# Patient Record
Sex: Female | Born: 1975
Health system: Southern US, Community
[De-identification: ages and names within clinical notes are randomized; demographics above are authoritative.]

## PROBLEM LIST (undated history)

## (undated) DIAGNOSIS — I10 Essential (primary) hypertension: Secondary | ICD-10-CM

## (undated) DIAGNOSIS — R011 Cardiac murmur, unspecified: Secondary | ICD-10-CM

## (undated) DIAGNOSIS — R896 Abnormal cytological findings in specimens from other organs, systems and tissues: Secondary | ICD-10-CM

## (undated) DIAGNOSIS — IMO0001 Reserved for inherently not codable concepts without codable children: Secondary | ICD-10-CM

## (undated) DIAGNOSIS — E119 Type 2 diabetes mellitus without complications: Secondary | ICD-10-CM

## (undated) DIAGNOSIS — T7840XA Allergy, unspecified, initial encounter: Secondary | ICD-10-CM

## (undated) DIAGNOSIS — D649 Anemia, unspecified: Secondary | ICD-10-CM

## (undated) HISTORY — DX: Allergy, unspecified, initial encounter: T78.40XA

## (undated) HISTORY — DX: Reserved for inherently not codable concepts without codable children: IMO0001

## (undated) HISTORY — DX: Essential (primary) hypertension: I10

## (undated) HISTORY — DX: Anemia, unspecified: D64.9

## (undated) HISTORY — PX: GALLBLADDER SURGERY: SHX652

## (undated) HISTORY — PX: CHOLECYSTECTOMY: SHX55

## (undated) HISTORY — DX: Cardiac murmur, unspecified: R01.1

## (undated) HISTORY — DX: Type 2 diabetes mellitus without complications: E11.9

## (undated) HISTORY — PX: WISDOM TOOTH EXTRACTION: SHX21

## (undated) HISTORY — DX: Abnormal cytological findings in specimens from other organs, systems and tissues: R89.6

---

## 2004-05-26 ENCOUNTER — Emergency Department (HOSPITAL_COMMUNITY): Admission: EM | Admit: 2004-05-26 | Discharge: 2004-05-26 | Payer: Self-pay | Admitting: Emergency Medicine

## 2004-11-27 HISTORY — PX: OTHER SURGICAL HISTORY: SHX169

## 2005-01-27 ENCOUNTER — Other Ambulatory Visit: Admission: RE | Admit: 2005-01-27 | Discharge: 2005-01-27 | Payer: Self-pay | Admitting: Family Medicine

## 2006-02-08 ENCOUNTER — Other Ambulatory Visit: Admission: RE | Admit: 2006-02-08 | Discharge: 2006-02-08 | Payer: Self-pay | Admitting: Family Medicine

## 2006-08-21 ENCOUNTER — Emergency Department (HOSPITAL_COMMUNITY): Admission: EM | Admit: 2006-08-21 | Discharge: 2006-08-21 | Payer: Self-pay | Admitting: Emergency Medicine

## 2006-11-05 ENCOUNTER — Other Ambulatory Visit: Admission: RE | Admit: 2006-11-05 | Discharge: 2006-11-05 | Payer: Self-pay | Admitting: Obstetrics and Gynecology

## 2007-03-09 ENCOUNTER — Emergency Department (HOSPITAL_COMMUNITY): Admission: EM | Admit: 2007-03-09 | Discharge: 2007-03-09 | Payer: Self-pay | Admitting: Emergency Medicine

## 2007-05-10 ENCOUNTER — Emergency Department (HOSPITAL_COMMUNITY): Admission: EM | Admit: 2007-05-10 | Discharge: 2007-05-11 | Payer: Self-pay | Admitting: Emergency Medicine

## 2007-06-06 ENCOUNTER — Encounter (INDEPENDENT_AMBULATORY_CARE_PROVIDER_SITE_OTHER): Payer: Self-pay | Admitting: General Surgery

## 2007-06-07 ENCOUNTER — Inpatient Hospital Stay (HOSPITAL_COMMUNITY): Admission: RE | Admit: 2007-06-07 | Discharge: 2007-06-08 | Payer: Self-pay | Admitting: General Surgery

## 2007-06-14 ENCOUNTER — Ambulatory Visit: Payer: Self-pay | Admitting: Internal Medicine

## 2008-07-08 IMAGING — RF DG CHOLANGIOGRAM OPERATIVE
1 series · 10 of 10 positions shown · non-contrast
Comparison: none

CLINICAL DATA: 30 year-old-female with cholecystitis.  Intraoperative cholangiogram.  Cannulation of the cystic duct.  The common bile duct is dilated.  There are common bile duct stones.  The visualized intrahepatic ducts appear slightly prominent also. There is contrast draining in the duodenum.

[Series 1: run · 4 acquisitions, 10 frames shown]
[im 1/4]
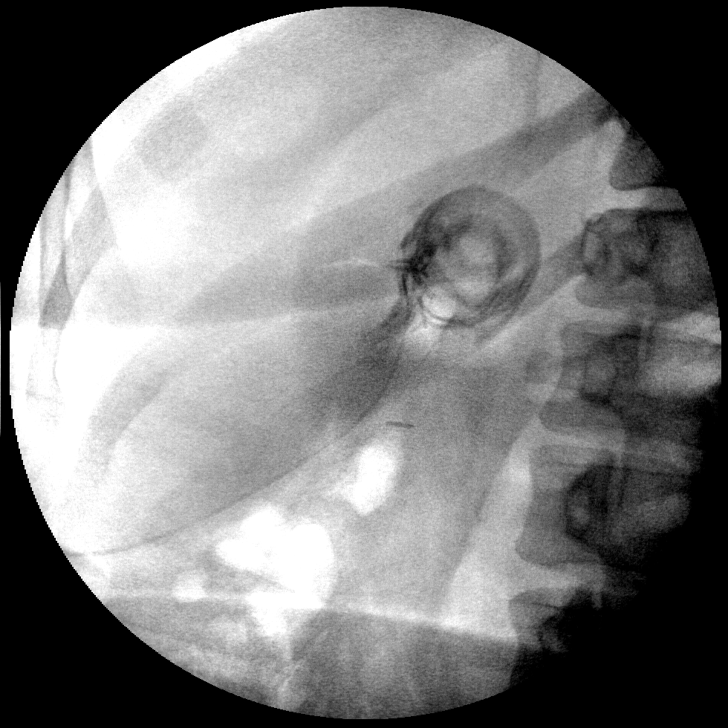
[im 1/4]
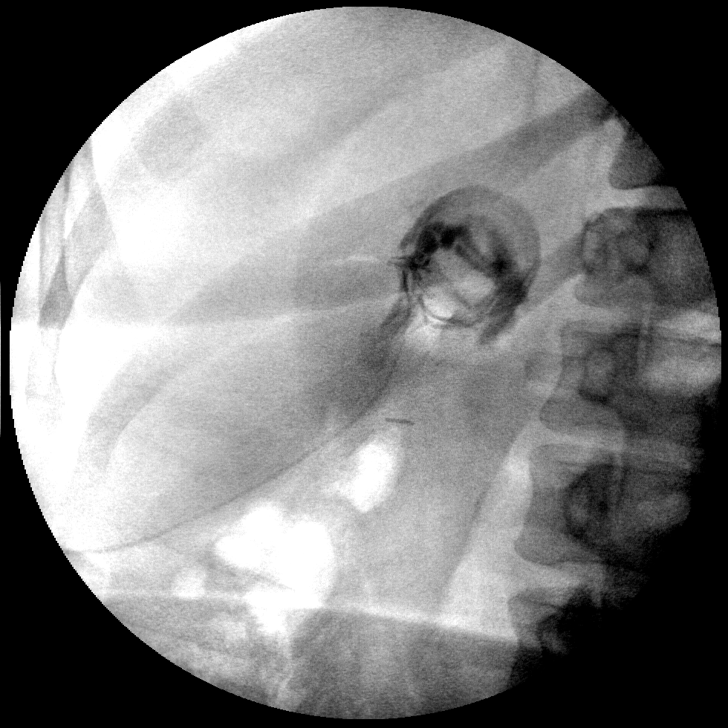
[im 1/4]
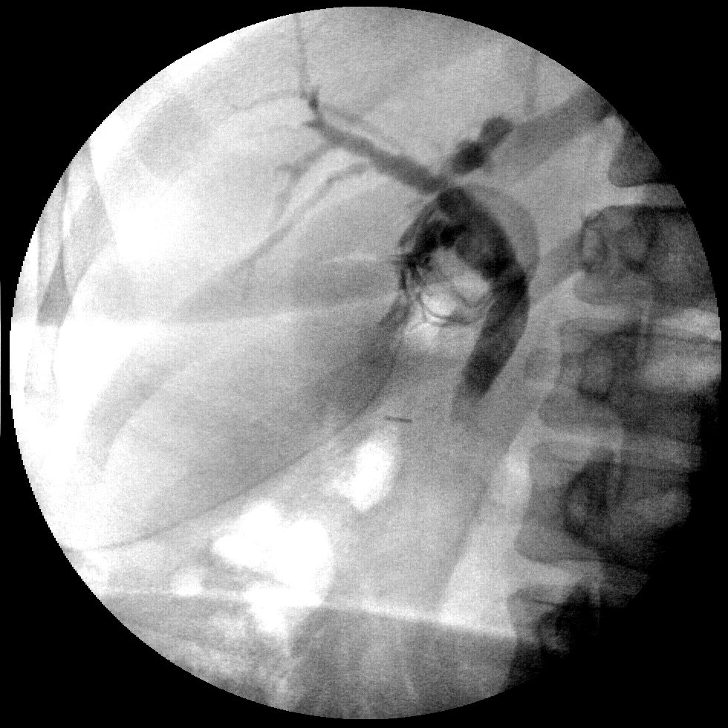
[im 1/4]
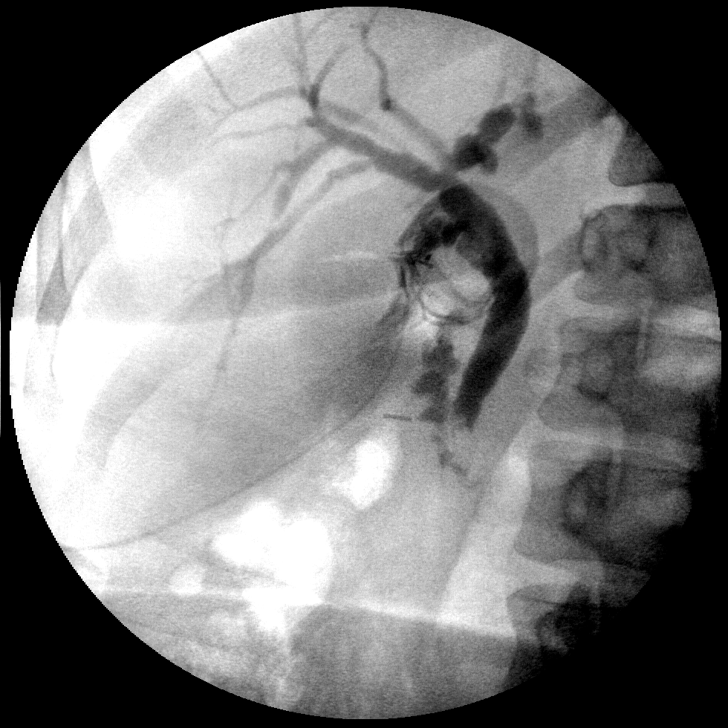
[im 2/4]
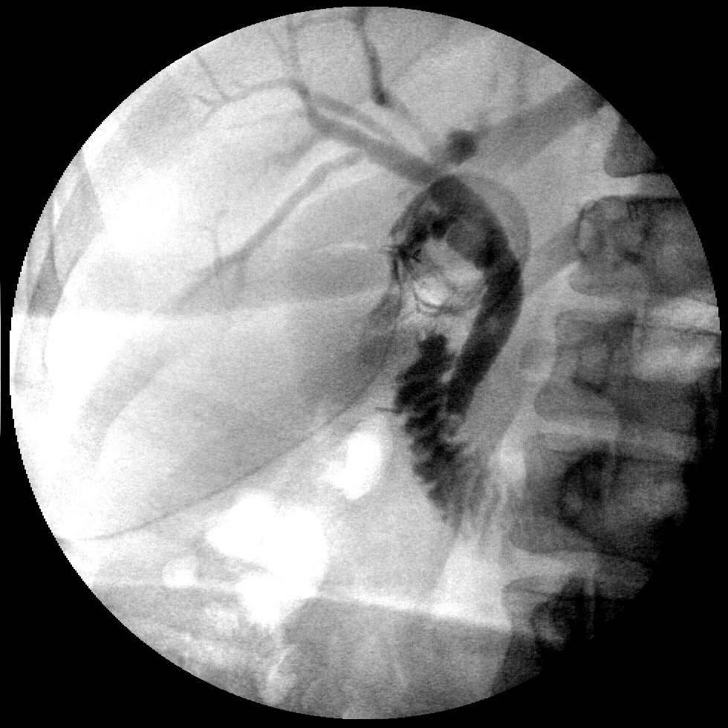
[im 2/4]
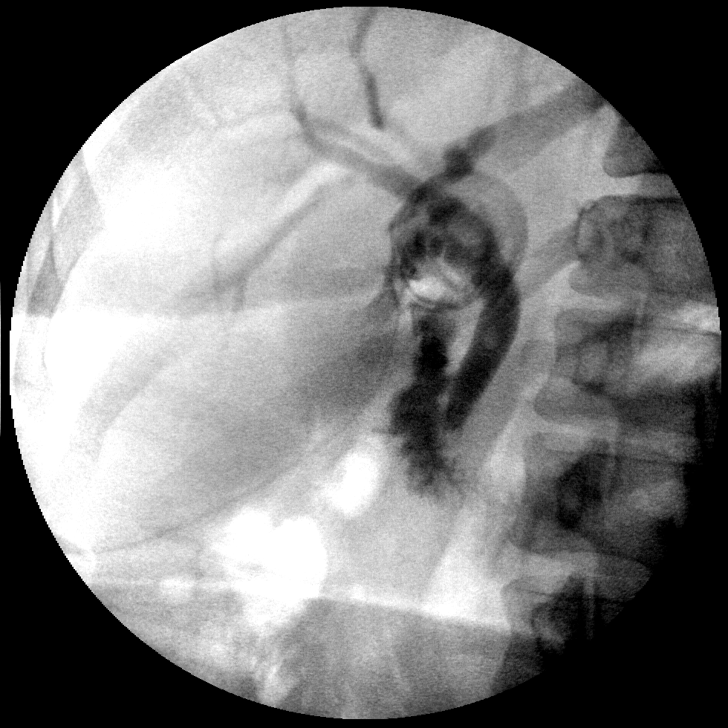
[im 2/4]
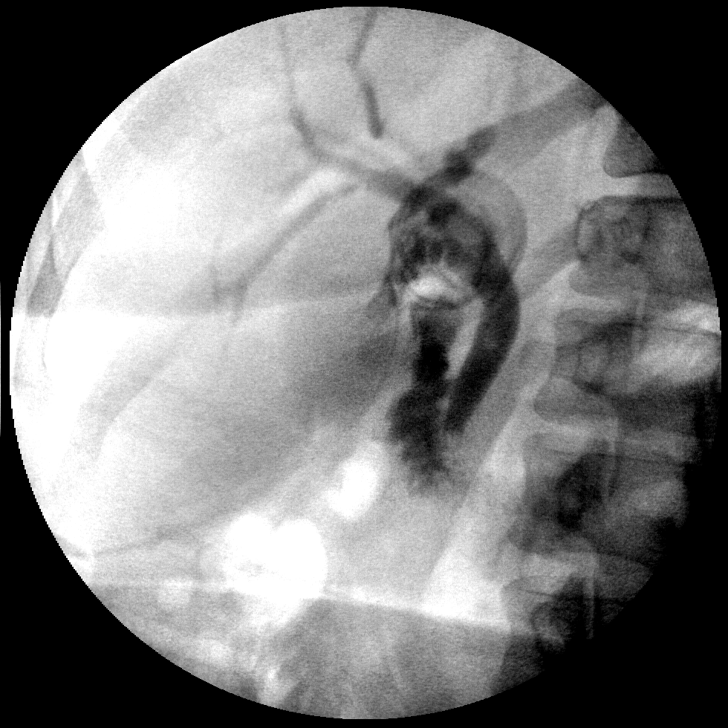
[im 2/4]
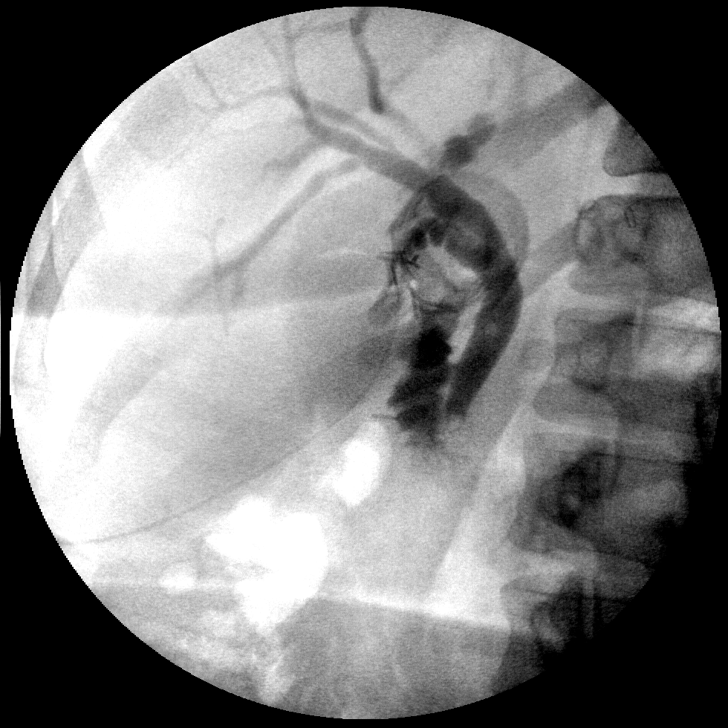
[im 3/4]
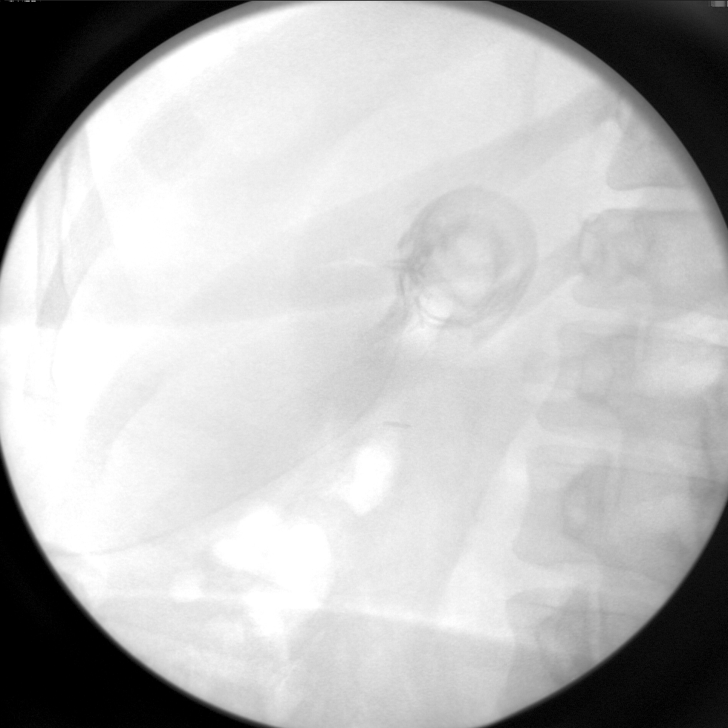
[im 4/4]
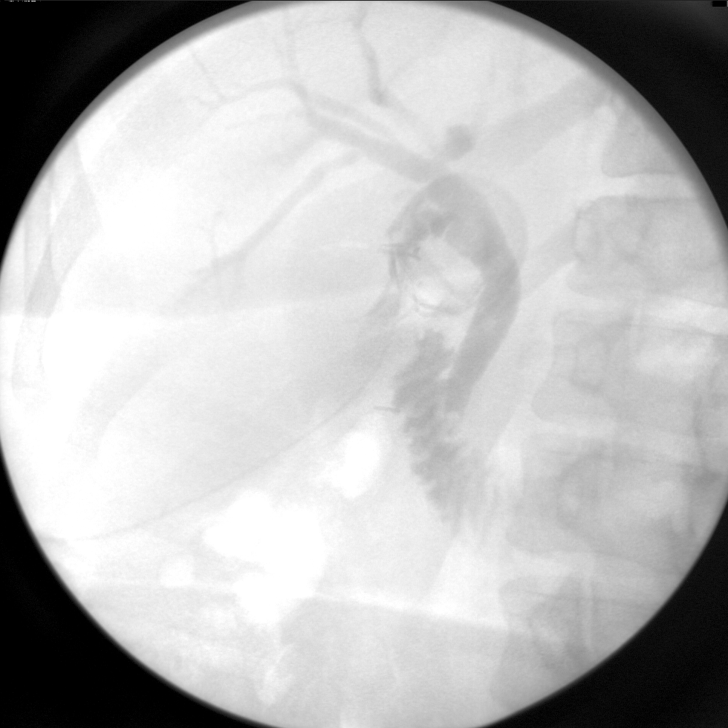

[10 of 10 positions shown; findings below may reference images not displayed]

IMPRESSION: Dilated common bile duct with common bile duct stones.

## 2008-07-09 IMAGING — XA DG ERCP WO/W SPHINCTEROTOMY
4 series · 4 of 4 positions shown · non-contrast
Comparison: none

CLINICAL DATA: Gallstones.  
 ERCP WITH SPHINCEROTOMY:

[Series 1: cont. · 1 of 1 slices shown (1 of 4)]
[im 1/1]
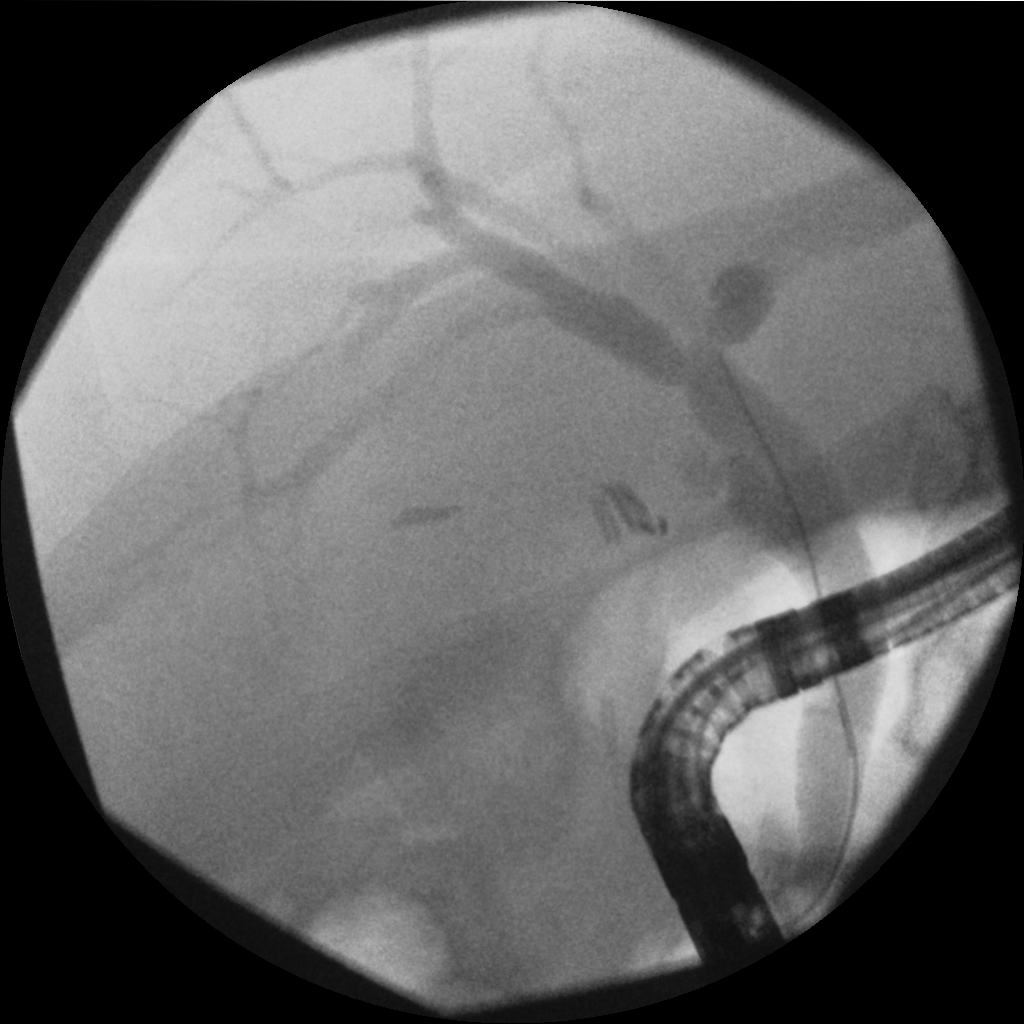

[Series 2: cont. · 1 of 1 slices shown (2 of 4)]
[im 1/1]
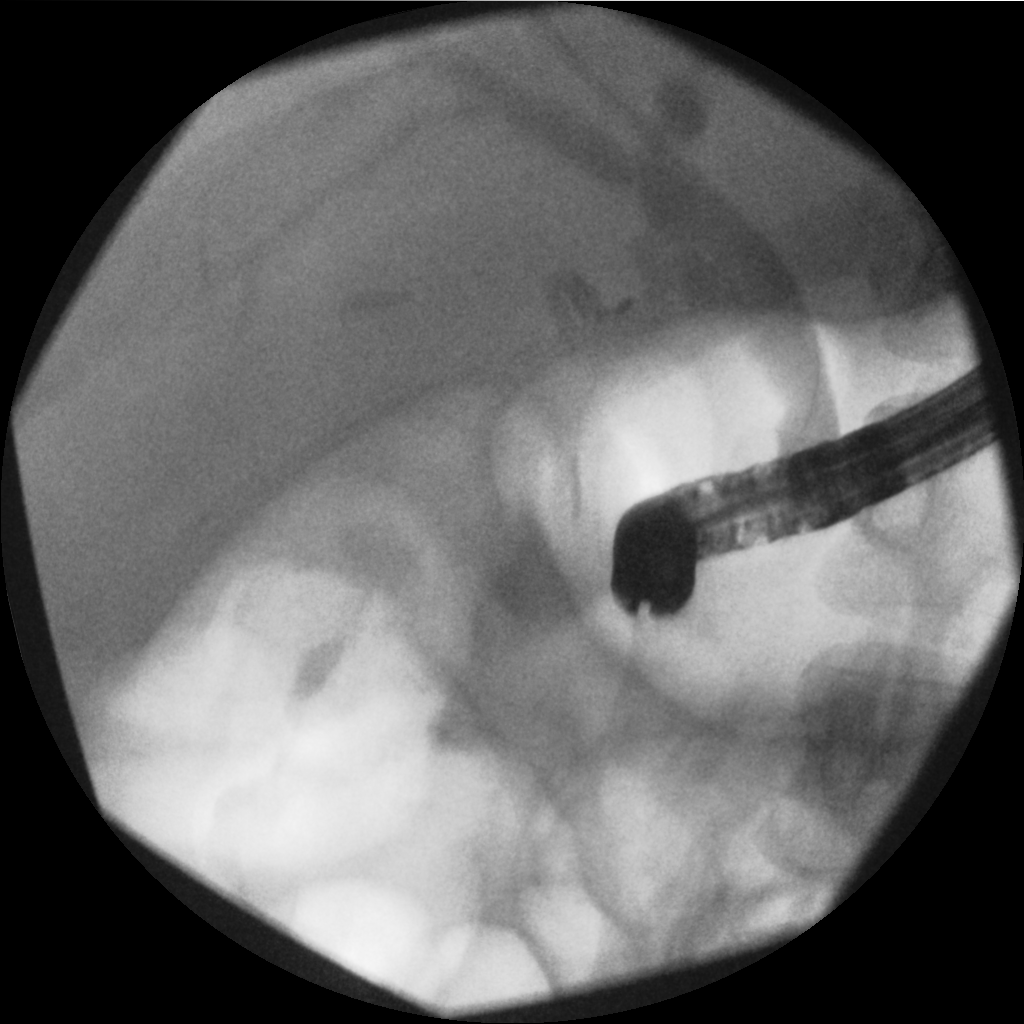

[Series 3: cont. · 1 of 1 slices shown (3 of 4)]
[im 1/1]
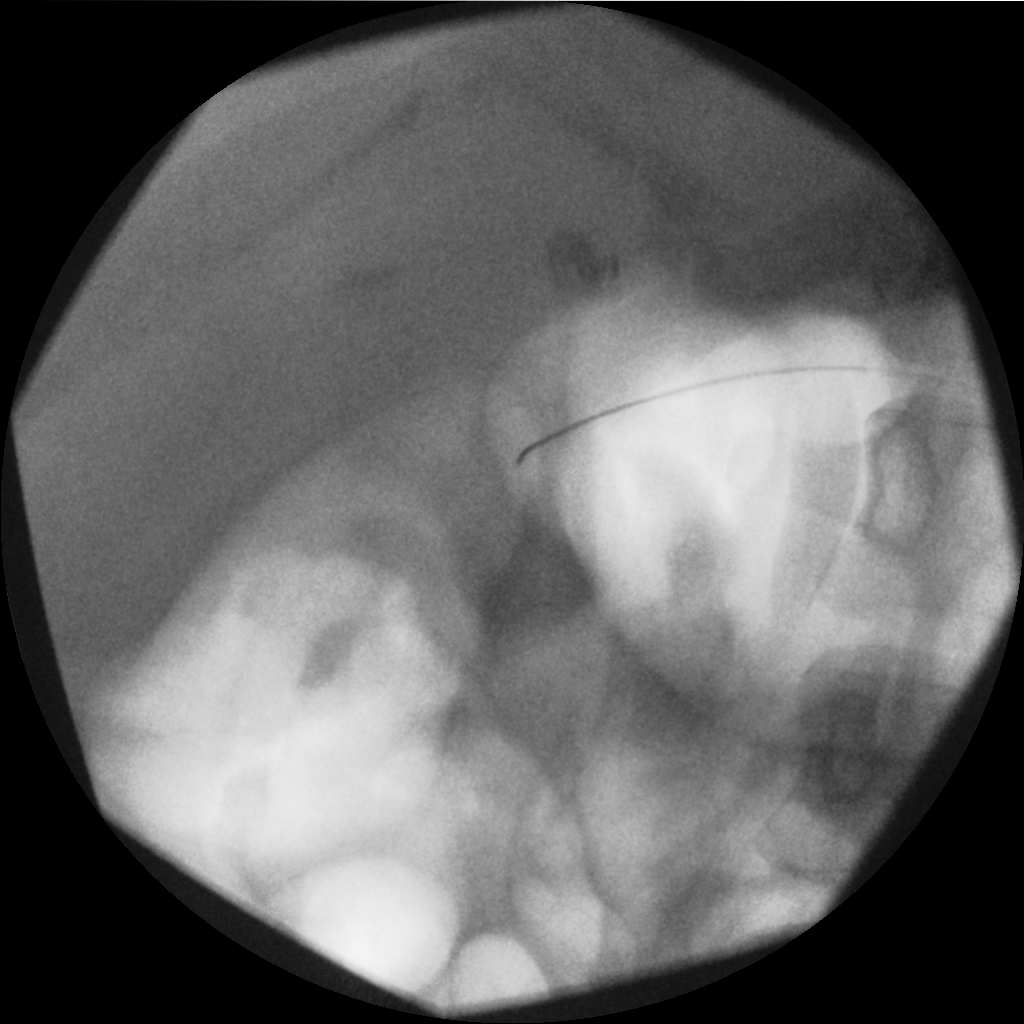

[Series 4: cont. · 1 of 1 slices shown (4 of 4)]
[im 1/1]
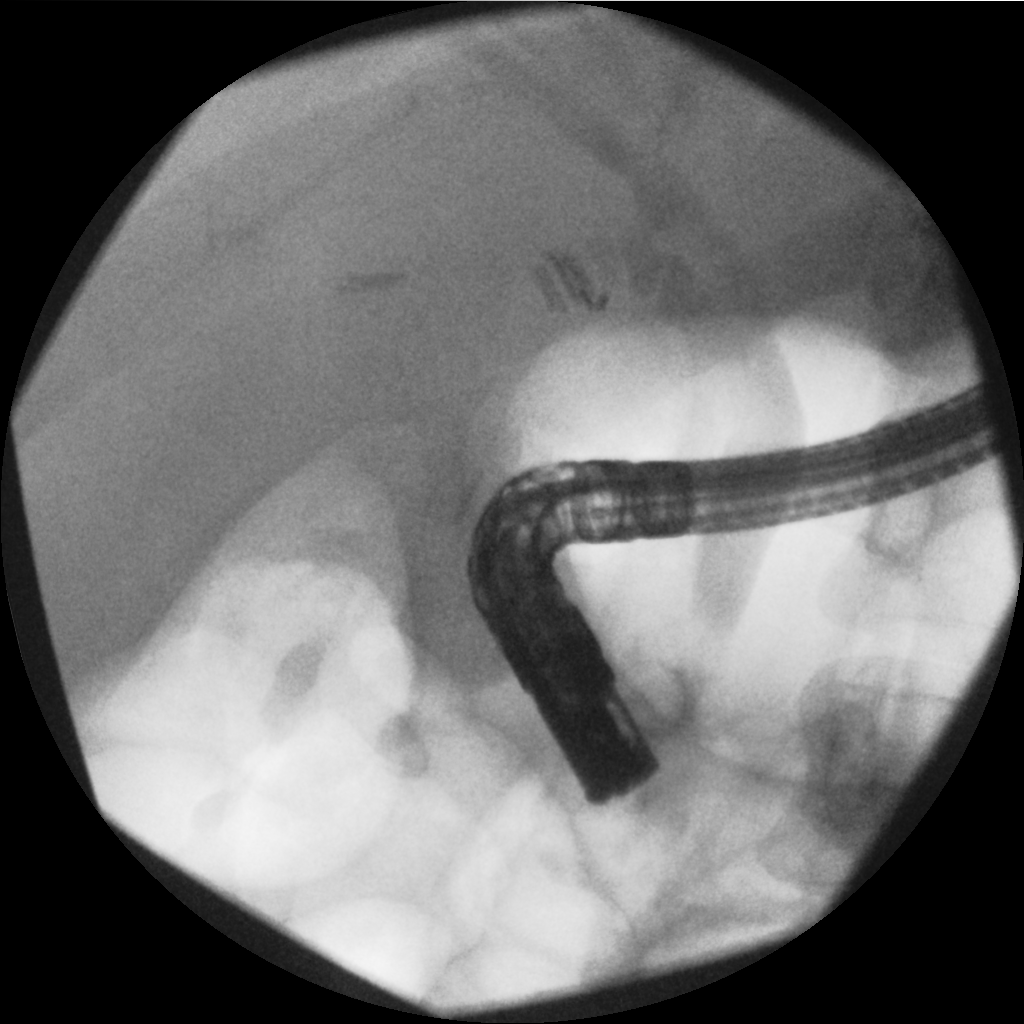

[4 of 4 positions shown; findings below may reference images not displayed]

FINDINGS: This study was done by Dr. Locklear.  Fluoroscopy was provided.  Radiologist was not involved during the exam. Apparently the patient underwent sphincterotomy with balloon sweeping.
IMPRESSION: Fluoroscopy provided for ERCP with sphincterotomy and stone retrieval.

## 2009-05-07 ENCOUNTER — Other Ambulatory Visit: Admission: RE | Admit: 2009-05-07 | Discharge: 2009-05-07 | Payer: Self-pay | Admitting: Obstetrics and Gynecology

## 2009-12-20 ENCOUNTER — Other Ambulatory Visit: Admission: RE | Admit: 2009-12-20 | Discharge: 2009-12-20 | Payer: Self-pay | Admitting: Obstetrics and Gynecology

## 2010-03-25 LAB — HEPATIC FUNCTION PANEL
AST: 20 U/L (ref 13–35)
Bilirubin, Total: 0.5 mg/dL

## 2010-03-25 LAB — BASIC METABOLIC PANEL
BUN: 9 mg/dL (ref 4–21)
Glucose: 91 mg/dL
Potassium: 4.2 mmol/L (ref 3.4–5.3)
Sodium: 139 mmol/L (ref 137–147)

## 2010-03-25 LAB — LIPID PANEL
HDL: 41 mg/dL (ref 35–70)
Triglycerides: 92 mg/dL (ref 40–160)

## 2010-07-11 ENCOUNTER — Other Ambulatory Visit: Admission: RE | Admit: 2010-07-11 | Discharge: 2010-07-11 | Payer: Self-pay | Admitting: Obstetrics and Gynecology

## 2010-11-16 ENCOUNTER — Encounter
Admission: RE | Admit: 2010-11-16 | Discharge: 2010-11-16 | Payer: Self-pay | Source: Home / Self Care | Attending: Family Medicine | Admitting: Family Medicine

## 2011-04-11 NOTE — Op Note (Signed)
Cynthia Lee, Cynthia Lee NO.:  [id number]   MEDICAL RECORD NO.:  [id number]          PATIENT TYPE:  OIB   LOCATION:  1533                         FACILITY:  [facility]   PHYSICIAN:  Anselm Pancoast. Weatherly, M.D.DATE OF BIRTH:  [birth year]   DATE OF PROCEDURE:  06/06/2007  DATE OF DISCHARGE:                               OPERATIVE REPORT   PREOPERATIVE DIAGNOSES:  Chronic cholecystitis with stones.   POSTOPERATIVE DIAGNOSES:  Chronic cholecystitis, many stones with  numerous stones and a common bile duct.   OPERATION:  Laparoscopic cholecystectomy with cholangiogram.   HISTORY:  Cynthia Lee is a 35 year old, overweight female, about 330  pounds, who has been to the emergency room and was released after having  episodes of epigastric pain.  It appears that she has had these pains  off and on and has had previous ultrasounds of the gallbladder that have  not noted stones.  This time, her ultrasound did show numerous stones.  It did not appear to be an acute cholecystitis and she was released.  They checked her liver function studies and they were definitely  abnormal.  Bilirubin was not elevated, but the SGOT and SGPT were  markedly elevated.  I saw her in the office on Monday and sent her for  repeat liver function studies and the repeat liver function studies were  improved with the SGOT and SGPT in about the 175 range and I elected to  proceed on with the cholangiogram.  They had noted, on the ultrasound,  that they thought that the bile duct was not dilated.  She is 330 pounds  and short, so I am sure it is difficult to see the structures extremely  well.  Preoperatively, she was given 400 mg of Cipro.  We have reviewed  the whole gallbladder booklet with her and she is aware that, if there  are stones found in the common bile duct, we will set up for an ERCP  tomorrow.   The patient was taken to the operating suite.  Dr. Shireen Quan was able to  successfully intubate  her.  They had a wedge up under her back and then  induction of general anesthesia.  I had the wedge removed, so we could  lay her flat, since she is so heavy, and it gives [place] better exposure to  the abdomen and she was prepped with Betadine surgical scrub and  solution and draped in a sterile manner.   I elected to make a little incision higher than the umbilicus, since she  is heavy enough and I think it would give [place] better visualization, and  then the upper 10 mm trocar was placed under direct vision, after we had  entered the peritoneal cavity and placed the Hasson cannula, after  traction sutures were placed.  The upper gallbladder area was very  dilated, but not acutely inflamed, and the two lateral 5 mm trocars were  placed.  We positioned her so that she had her head up, turned way to  the left, and then, to grasp the gallbladder, retract it upward,  outward, and  then the proximal portion of the gallbladder could be  finally visualized.  We then very carefully opened the peritoneum over  the distal cystic duct gallbladder area and I could identify the cystic  artery that was carefully separated from the cystic duct.  We put two  clips on it proximally, singly distally and divided and then very  carefully went around the cystic duct.  It looks like it is definitely  dilated.  I placed a clip flush with the cystic duct gallbladder  junction, made a little small opening, and then we could slip the hook  catheter into the proximal, held in place with a clip, and obtained an x-  ray.  The x-ray does show definitely numerous stones in the distal  common bile duct and we will need to get an ERCP tomorrow.  We then  cleaned off the cystic duct, so I could actually get four clips across  it.  I thought that we would possibly have to use a little Endoloop, but  since I got four good clips on it, I think that will be adequate.   With the cystic duct divided, then we could start very  carefully peeling  the distended gallbladder away from the gallbladder bed.  Good  hemostasis was obtained and then the gallbladder was placed into an  EndoCatch bag.  We then brought the bag up to the Hasson cannula,  withdrew both cameras in the upper 10 mm port, and then opened the bag  and probably removed 500 gallstones.  Most of them were about 3 x 4 mm  in size.  There were five or six stones in the gallbladder that were 1.5  cm in size.  The patient's fascia, after the bag containing the stones  and all were removed, was closed with figure-of-eight of 0 Vicryl in  addition to the two traction sutures that were also tied.  We  anesthetized the fascia.  There was no evidence of any bleeding or bile.  The 5 mm ports were withdrawn after a little irrigating fluid had been  aspirated, and then the upper 10 mm trocars were withdrawn under direct  vision after the CO2 was released.  The subcutaneous wounds were closed  with 4-0 Vicryl and benzoin and the patient sent to the recovery room in  a stable postoperative condition.   We will arrange for Dr. Leone Payor to see the patient for the ERCP,  hopefully tomorrow.           ______________________________  Anselm Pancoast. Zachery Dakins, M.D.     WJW/MEDQ  D:  06/06/2007  T:  06/07/2007  Job:  [id number]

## 2011-04-14 NOTE — Discharge Summary (Signed)
[name], [name] NO.:  [id number]   MEDICAL RECORD NO.:  [id number]          PATIENT TYPE:  INP   LOCATION:  1533                         FACILITY:  [facility]   PHYSICIAN:  Anselm Pancoast. Weatherly, M.D.DATE OF BIRTH:  04/18/1976   DATE OF ADMISSION:  06/06/2007  DATE OF DISCHARGE:  06/08/2007                               DISCHARGE SUMMARY   REASON FOR ADMISSION:  Chronic cholecystitis and stones, common duct  stones.   OPERATIONS:  Laparoscopic cholecystectomy with cholangiogram and two  second procedures ERCP with sphincterotomy and stone removal.   HISTORY:  Cynthia Lee is a 35 year old female who presented to the  emergency room with episodes of abdominal right upper quadrant pain.  This occurred off and on, and previous ultrasound years ago did not show  any stones.  At this time, the ultrasound did show numerous stones, but  it did not appear to be acute cholecystitis, and she was released.  They  checked liver function studies and these were abnormal.  SGOT and SGPT  were elevated and I saw her in the office in July.  They noted on the  ultrasound that her common bile duct was not dilated.  The patient  weighs about 330 pounds, and it is difficult to see the common bile duct  very easily.  She presents now for elective cholecystectomy, and she was  taken to the operative suite.  Dr. Carney Living was able to successfully  intubate her, and then we did her cholecystectomy with the findings of  several stones in the common bile duct on the cholangiogram.  The  gallbladder was removed, and I talked with Dr. Leone Payor postoperatively.  He said he would see her and plan on doing an ERCP the following day.  They did get her to surgery the following day, and she was induced with  general anesthesia again, this time no problem, and then the  sphincterotomy was performed with several stones removed.  Postoperatively, she denied problems with nausea and vomiting and was  ready to be discharged the following morning.  She will see [place] in the  office for follow-up in approximately one week, and her incision  appeared to be healing nicely without problems of infection.  She has  Vicodin for incisional pain.   DISCHARGE DIAGNOSIS:  Chronic cholecystitis with gallstones and common  duct stones.   OPERATIONS:  Laparoscopic cholecystectomy with cholangiogram and also  ERCP with sphincterotomy and stone removal.           ______________________________  Anselm Pancoast. Zachery Dakins, M.D.     WJW/MEDQ  D:  07/15/2007  T:  07/16/2007  Job:  [id number]   cc:   Iva Boop, MD,FACG  [facility]  [address] [place], [place] [address]

## 2011-09-12 LAB — HEPATIC FUNCTION PANEL
AST: 171 — ABNORMAL HIGH
Bilirubin, Direct: 0.2
Total Bilirubin: 0.6

## 2011-09-12 LAB — HEMOGLOBIN AND HEMATOCRIT, BLOOD: Hemoglobin: 13

## 2011-09-12 LAB — PREGNANCY, URINE: Preg Test, Ur: NEGATIVE

## 2011-09-12 LAB — PROTIME-INR: INR: 1

## 2011-09-14 LAB — DIFFERENTIAL
Lymphocytes Relative: 21
Monocytes Absolute: 0.5
Monocytes Relative: 9
Neutro Abs: 3.8
Neutrophils Relative %: 67

## 2011-09-14 LAB — URINALYSIS, ROUTINE W REFLEX MICROSCOPIC
Glucose, UA: NEGATIVE
Protein, ur: NEGATIVE
pH: 5.5

## 2011-09-14 LAB — CBC
Hemoglobin: 12
RBC: 4.57
WBC: 5.6

## 2011-09-14 LAB — BASIC METABOLIC PANEL
Calcium: 9.1
GFR calc Af Amer: >60
GFR calc non Af Amer: >60
Sodium: 137

## 2011-09-14 LAB — URINE MICROSCOPIC-ADD ON

## 2011-09-14 LAB — HEPATIC FUNCTION PANEL
AST: 611 — ABNORMAL HIGH
Albumin: 3.2 — ABNORMAL LOW
Alkaline Phosphatase: 77
Total Bilirubin: 0.7
Total Protein: 7.2

## 2012-10-02 ENCOUNTER — Ambulatory Visit: Payer: Self-pay | Admitting: Physician Assistant

## 2012-10-07 ENCOUNTER — Encounter: Payer: Self-pay | Admitting: Physician Assistant

## 2012-10-07 ENCOUNTER — Ambulatory Visit (INDEPENDENT_AMBULATORY_CARE_PROVIDER_SITE_OTHER): Payer: BC Managed Care – PPO | Admitting: Physician Assistant

## 2012-10-07 VITALS — BP 151/93 | HR 78 | Wt 348.0 lb

## 2012-10-07 DIAGNOSIS — IMO0001 Reserved for inherently not codable concepts without codable children: Secondary | ICD-10-CM

## 2012-10-07 DIAGNOSIS — I8390 Asymptomatic varicose veins of unspecified lower extremity: Secondary | ICD-10-CM

## 2012-10-07 DIAGNOSIS — Z13220 Encounter for screening for lipoid disorders: Secondary | ICD-10-CM

## 2012-10-07 DIAGNOSIS — Z862 Personal history of diseases of the blood and blood-forming organs and certain disorders involving the immune mechanism: Secondary | ICD-10-CM

## 2012-10-07 DIAGNOSIS — Z131 Encounter for screening for diabetes mellitus: Secondary | ICD-10-CM

## 2012-10-07 DIAGNOSIS — R030 Elevated blood-pressure reading, without diagnosis of hypertension: Secondary | ICD-10-CM

## 2012-10-07 DIAGNOSIS — E669 Obesity, unspecified: Secondary | ICD-10-CM

## 2012-10-07 LAB — CBC WITH DIFFERENTIAL/PLATELET
Basophils Relative: 1 % (ref 0–1)
Eosinophils Relative: 2 % (ref 0–5)
HCT: 37.7 % (ref 36.0–46.0)
Hemoglobin: 12.9 g/dL (ref 12.0–15.0)
MCH: 27.1 pg (ref 26.0–34.0)
MCHC: 34.2 g/dL (ref 30.0–36.0)
MCV: 79.2 fL (ref 78.0–100.0)
Monocytes Absolute: 0.3 10*3/uL (ref 0.1–1.0)
Monocytes Relative: 6 % (ref 3–12)
Neutro Abs: 3.5 10*3/uL (ref 1.7–7.7)

## 2012-10-07 LAB — FERRITIN: Ferritin: 29 ng/mL (ref 10–291)
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
## 2012-10-07 NOTE — Patient Instructions (Addendum)
Varicose Veins Varicose veins are veins that have become enlarged and twisted. CAUSES This condition is the result of valves in the veins not working properly. Valves in the veins help return blood from the leg to the heart. If these valves are damaged, blood flows backwards and backs up into the veins in the leg near the skin. This causes the veins to become larger. People who are on their feet a lot, who are pregnant, or who are overweight are more likely to develop varicose veins. SYMPTOMS   Bulging, twisted-appearing, bluish veins, most commonly found on the legs.  Leg pain or a feeling of heaviness. These symptoms may be worse at the end of the day.  Leg swelling.  Skin color changes. DIAGNOSIS  Varicose veins can usually be diagnosed with an exam of your legs by your caregiver. He or she may recommend an ultrasound of your leg veins. TREATMENT  Most varicose veins can be treated at home.However, other treatments are available for people who have persistent symptoms or who want to treat the cosmetic appearance of the varicose veins. These include:  Laser treatment of very small varicose veins.  Medicine that is shot (injected) into the vein. This medicine hardens the walls of the vein and closes off the vein. This treatment is called sclerotherapy. Afterwards, you may need to wear clothing or bandages that apply pressure.  Surgery. HOME CARE INSTRUCTIONS   Do not stand or sit in one position for long periods of time. Do not sit with your legs crossed. Rest with your legs raised during the day.  Wear elastic stockings or support hose. Do not wear other tight, encircling garments around the legs, pelvis, or waist.  Walk as much as possible to increase blood flow.  Raise the foot of your bed at night with 2-inch blocks.  If you get a cut in the skin over the vein and the vein bleeds, lie down with your leg raised and press on it with a clean cloth until the bleeding stops. Then  place a bandage (dressing) on the cut. See your caregiver if it continues to bleed or needs stitches. SEEK MEDICAL CARE IF:   The skin around your ankle starts to break down.  You have pain, redness, tenderness, or hard swelling developing in your leg over a vein.  You are uncomfortable due to leg pain. Document Released: 08/23/2005 Document Revised: 02/05/2012 Document Reviewed: 01/09/2011 [facility] Patient Information 2013 [place], [place]. DASH Diet The DASH diet stands for "Dietary Approaches to Stop Hypertension." It is a healthy eating plan that has been shown to reduce high blood pressure (hypertension) in as little as 14 days, while also possibly providing other significant health benefits. These other health benefits include reducing the risk of breast cancer after menopause and reducing the risk of type 2 diabetes, heart disease, colon cancer, and stroke. Health benefits also include weight loss and slowing kidney failure in patients with chronic kidney disease.  DIET GUIDELINES  Limit salt (sodium). Your diet should contain less than 1500 mg of sodium daily.  Limit refined or processed carbohydrates. Your diet should include mostly whole grains. Desserts and added sugars should be used sparingly.  Include small amounts of heart-healthy fats. These types of fats include nuts, oils, and tub margarine. Limit saturated and trans fats. These fats have been shown to be harmful in the body. CHOOSING FOODS  The following food groups are based on a 2000 calorie diet. See your Registered Dietitian for individual calorie needs.  Grains and Grain Products (6 to 8 servings daily)  Eat More Often: Whole-wheat bread, brown rice, whole-grain or wheat pasta, quinoa, popcorn without added fat or salt (air popped).  Eat Less Often: White bread, white pasta, white rice, cornbread. Vegetables (4 to 5 servings daily)  Eat More Often: Fresh, frozen, and canned vegetables. Vegetables may be raw, steamed,  roasted, or grilled with a minimal amount of fat.  Eat Less Often/Avoid: Creamed or fried vegetables. Vegetables in a cheese sauce. Fruit (4 to 5 servings daily)  Eat More Often: All fresh, canned (in natural juice), or frozen fruits. Dried fruits without added sugar. One hundred percent fruit juice ( cup [237 mL] daily).  Eat Less Often: Dried fruits with added sugar. Canned fruit in light or heavy syrup. [employer], Fish, and Poultry (2 servings or less daily. One serving is 3 to 4 oz [85-114 g]).  Eat More Often: Ninety percent or leaner ground beef, tenderloin, sirloin. Round cuts of beef, chicken breast, [place] breast. All fish. Grill, bake, or broil your meat. Nothing should be fried.  Eat Less Often/Avoid: Fatty cuts of meat, [place], or chicken leg, thigh, or wing. Fried cuts of meat or fish. Dairy (2 to 3 servings)  Eat More Often: Low-fat or fat-free milk, low-fat plain or light yogurt, reduced-fat or part-skim cheese.  Eat Less Often/Avoid: Milk (whole, 2%).Whole milk yogurt. Full-fat cheeses. Nuts, Seeds, and Legumes (4 to 5 servings per week)  Eat More Often: All without added salt.  Eat Less Often/Avoid: Salted nuts and seeds, canned beans with added salt. Fats and Sweets (limited)  Eat More Often: Vegetable oils, tub margarines without trans fats, sugar-free gelatin. Mayonnaise and salad dressings.  Eat Less Often/Avoid: Coconut oils, palm oils, butter, stick margarine, cream, half and half, cookies, candy, pie. FOR MORE INFORMATION The Dash Diet Eating Plan: www.dashdiet.org Document Released: 11/02/2011 Document Revised: 02/05/2012 Document Reviewed: 11/02/2011 [facility] Patient Information 2013 [place], [place].

## 2012-10-09 DIAGNOSIS — E669 Obesity, unspecified: Secondary | ICD-10-CM | POA: Insufficient documentation

## 2012-10-09 DIAGNOSIS — Z6841 Body Mass Index (BMI) 40.0 and over, adult: Secondary | ICD-10-CM | POA: Insufficient documentation

## 2012-10-09 DIAGNOSIS — Z862 Personal history of diseases of the blood and blood-forming organs and certain disorders involving the immune mechanism: Secondary | ICD-10-CM | POA: Insufficient documentation

## 2012-10-09 DIAGNOSIS — I8390 Asymptomatic varicose veins of unspecified lower extremity: Secondary | ICD-10-CM | POA: Insufficient documentation

## 2012-10-09 LAB — COMPREHENSIVE METABOLIC PANEL
AST: 16 U/L (ref 0–37)
Albumin: 4 g/dL (ref 3.5–5.2)
Alkaline Phosphatase: 56 U/L (ref 39–117)
BUN: 9 mg/dL (ref 6–23)
Glucose, Bld: 108 mg/dL — ABNORMAL HIGH (ref 70–99)
Potassium: 4.2 mEq/L (ref 3.5–5.3)
Sodium: 135 mEq/L (ref 135–145)
Total Bilirubin: 0.4 mg/dL (ref 0.3–1.2)

## 2012-10-09 LAB — LIPID PANEL
LDL Cholesterol: 120 mg/dL — ABNORMAL HIGH (ref 0–99)
Total CHOL/HDL Ratio: 4.4 Ratio
Triglycerides: 105 mg/dL (ref <150)
VLDL: 21 mg/dL (ref 0–40)

## 2012-10-09 LAB — IBC PANEL: %SAT: 13 % — ABNORMAL LOW (ref 20–55)

## 2012-10-31 ENCOUNTER — Encounter: Payer: Self-pay | Admitting: Physician Assistant

## 2012-10-31 DIAGNOSIS — IMO0001 Reserved for inherently not codable concepts without codable children: Secondary | ICD-10-CM | POA: Insufficient documentation

## 2012-10-31 DIAGNOSIS — R896 Abnormal cytological findings in specimens from other organs, systems and tissues: Secondary | ICD-10-CM

## 2012-11-01 ENCOUNTER — Encounter: Payer: Self-pay | Admitting: *Deleted

## 2012-11-04 ENCOUNTER — Ambulatory Visit (INDEPENDENT_AMBULATORY_CARE_PROVIDER_SITE_OTHER): Payer: BC Managed Care – PPO | Admitting: Physician Assistant

## 2012-11-04 VITALS — BP 183/88 | HR 77 | Wt 351.0 lb

## 2012-11-04 DIAGNOSIS — R7301 Impaired fasting glucose: Secondary | ICD-10-CM

## 2012-11-04 DIAGNOSIS — E119 Type 2 diabetes mellitus without complications: Secondary | ICD-10-CM

## 2012-11-04 DIAGNOSIS — I10 Essential (primary) hypertension: Secondary | ICD-10-CM

## 2012-11-04 LAB — POCT GLYCOSYLATED HEMOGLOBIN (HGB A1C): Hemoglobin A1C: 6.8

## 2012-11-04 MED ORDER — METFORMIN HCL 500 MG PO TABS: 500.0000 mg | ORAL_TABLET | Freq: Two times a day (BID) | ORAL | Status: DC

## 2012-11-04 MED ORDER — LISINOPRIL-HYDROCHLOROTHIAZIDE 20-12.5 MG PO TABS: 1.0000 | ORAL_TABLET | Freq: Every day | ORAL | Status: DC

## 2012-11-04 NOTE — Patient Instructions (Addendum)
Limit Carbs. Consider Diabetic class or nutrition referral.  Start Lisinopril HCTZ daily. Continue low salt diet.    Diabetes and Exercise Regular exercise is important and can help:   Control blood glucose (sugar).  Decrease blood pressure.    Control blood lipids (cholesterol, triglycerides).  Improve overall health. BENEFITS FROM EXERCISE  Improved fitness.  Improved flexibility.  Improved endurance.  Increased bone density.  Weight control.  Increased muscle strength.  Decreased body fat.  Improvement of the body's use of insulin, a hormone.  Increased insulin sensitivity.  Reduction of insulin needs.  Reduced stress and tension.  Helps you feel better. People with diabetes who add exercise to their lifestyle gain additional benefits, including:  Weight loss.  Reduced appetite.  Improvement of the body's use of blood glucose.  Decreased risk factors for heart disease:  Lowering of cholesterol and triglycerides.  Raising the level of good cholesterol (high-density lipoproteins, HDL).  Lowering blood sugar.  Decreased blood pressure. TYPE 1 DIABETES AND EXERCISE  Exercise will usually lower your blood glucose.  If blood glucose is greater than 240 mg/dl, check urine ketones. If ketones are present, do not exercise.  Location of the insulin injection sites may need to be adjusted with exercise. Avoid injecting insulin into areas of the body that will be exercised. For example, avoid injecting insulin into:  The arms when playing tennis.  The legs when jogging. For more information, discuss this with your caregiver.  Keep a record of:  Food intake.  Type and amount of exercise.  Expected peak times of insulin action.  Blood glucose levels. Do this before, during, and after exercise. Review your records with your caregiver. This will help you to develop guidelines for adjusting food intake and insulin amounts.  TYPE 2 DIABETES AND  EXERCISE  Regular physical activity can help control blood glucose.  Exercise is important because it may:  Increase the body's sensitivity to insulin.  Improve blood glucose control.  Exercise reduces the risk of heart disease. It decreases serum cholesterol and triglycerides. It also lowers blood pressure.  Those who take insulin or oral hypoglycemic agents should watch for signs of hypoglycemia. These signs include dizziness, shaking, sweating, chills, and confusion.  Body water is lost during exercise. It must be replaced. This will help to avoid loss of body fluids (dehydration) or heat stroke. Be sure to talk to your caregiver before starting an exercise program to make sure it is safe for you. Remember, any activity is better than none.  Document Released: 02/03/2004 Document Revised: 02/05/2012 Document Reviewed: 05/20/2009 [facility] Patient Information 2013 [place], [place].   1500 Calorie Diabetic Diet The 1500 calorie diabetic diet limits calories to 1500 each day. Following this diet and making healthy meal choices can help improve overall health. It controls blood glucose (sugar) levels and can also help lower blood pressure and cholesterol.  SERVING SIZES Measuring foods and serving sizes helps to make sure you are getting the right amount of food. The list below tells how big or small some common serving sizes are.  1 oz.........4 stacked dice.  3 oz........[name] KitchenDeck of cards.  1 tsp.......[name] KitchenTip of little finger.  1 tbs......[name] KitchenMarland KitchenThumb.  2 tbs.......[name] KitchenGolf ball.   cup......[name] KitchenHalf of a fist.  1 cup.......[name] KitchenA fist. GUIDELINES FOR CHOOSING FOODS The goal of this diet is to eat a variety of foods and limit calories to 1500 each day. This can be done by choosing foods that are low in calories and fat. The diet also suggests eating  small amounts of food frequently. Doing this helps control your blood glucose levels, so they do not get too high or too low. Each meal or snack may  include a protein food source to help you feel more satisfied. Try to eat about the same amount of food around the same time each day. This includes weekend days, travel days, and days off work. Space your meals about 4 to 5 hours apart, and add a snack between them, if you wish.  For example, a daily food plan could include breakfast, a morning snack, lunch, dinner, and an evening snack. Healthy meals and snacks have different types of foods, including whole grains, vegetables, fruits, lean meats, poultry, fish, and dairy products. As you plan your meals, select a variety of foods. Choose from the bread and starch, vegetable, fruit, dairy, and meat/protein groups. Examples of foods from each group are listed below, with their suggested serving sizes. Use measuring cups and spoons to become familiar with what a healthy portion looks like. Bread and Starch Each serving equals 15 grams of carbohydrate.  1 slice bread.   bagel.   cup cold cereal (unsweetened).   cup hot cereal or mashed potatoes.  1 small potato (size of a computer mouse).   cup cooked pasta or rice.   English muffin.  1 cup broth-based soup.  3 cups of popcorn.  4 to 6 whole-wheat crackers.   cup cooked beans, peas, or corn. Vegetables Each serving equals 5 grams of carbohydrate.   cup cooked vegetables.  1 cup raw vegetables.   cup tomato or vegetable juice. Fruit Each serving equals 15 grams of carbohydrate.  1 small apple or orange.  1  cup watermelon or strawberries.   cup applesauce (no sugar added).  2 tbs raisins.   banana.   cup canned fruit, packed in water or in its own juice.   cup unsweetened fruit juice. Dairy Each serving equals 12 to 15 grams of carbohydrate.  1 cup fat-free milk.  6 oz artificially sweetened yogurt or plain yogurt.  1 cup low-fat buttermilk.  1 cup soy milk.  1 cup almond milk. Meat/Protein  1 large egg.  2 to 3 oz meat, poultry, or  fish.   cup low-fat cottage cheese.  1 tbs peanut butter.  1 oz low-fat cheese.   cup tuna, packed in water.   cup tofu. Fat  1 tsp oil.  1 tsp trans-fat-free margarine.  1 tsp butter.  1 tsp mayonnaise.  2 tbs avocado.  1 tbs salad dressing.  1 tbs cream cheese.  2 tbs sour cream. SAMPLE 1500 CALORIE DIET PLAN Breakfast   whole-wheat English muffin (1 carb serving).  1 tsp trans-fat-free margarine.  1 scrambled egg.  1 cup fat-free milk (1 carb serving).  1 small orange (1 carb serving). Lunch  Chicken wrap.  1 whole-wheat tortilla, 8-inch (1 carb servings).  2 oz chicken breast, sliced.  2 tbs low-fat salad dressing, such as [place].   cup shredded lettuce.  2 slices tomato.   cup carrot sticks.  1 small apple (1 carb serving). Afternoon Snack  3 graham cracker squares (1 carb serving).  1 tbs peanut butter. Dinner  2 oz lean pork chop, broiled.  1 cup brown rice (3 carb servings).   cup steamed carrots.   cup green beans.  1 cup fat-free milk (1 carb serving).  1 tsp trans-fat-free margarine. Evening Snack   cup low-fat cottage cheese.  1 small peach or pear,  sliced (or  cup canned in water) (1 carb serving). MEAL PLAN You can use this worksheet to help you make a daily meal plan based on the 1500 calorie diabetic diet suggestions. If you are using this plan to help you control your blood glucose, you may interchange carbohydrate containing foods (dairy, starches, and fruits). Select a variety of fresh foods of varying colors and flavors. The total amount of carbohydrate in your meals or snacks is more important than making sure you include all of the food groups every time you eat. You can choose from approximately this many of the following foods to build your day's meals:  6 Starches.  3 Vegetables.  2 Fruits.  2 Dairy.  4 to 6 oz Meat/Protein.  Up to 3 Fats. Your dietician can use this worksheet to help  you decide how many servings and which types of foods are right for you. BREAKFAST Food Group and Servings / Food Choice Starch _________________________________________________________ Dairy __________________________________________________________ Fruit ___________________________________________________________ Meat/Protein____________________________________________________ Fat ____________________________________________________________ LUNCH Food Group and Servings / Food Choice  Starch _________________________________________________________ Meat/Protein ___________________________________________________ Vegetables _____________________________________________________ Fruit __________________________________________________________ Dairy __________________________________________________________ Fat ____________________________________________________________ Aura Fey Food Group and Servings / Food Choice Dairy __________________________________________________________ Starch _________________________________________________________ Meat/Protein____________________________________________________ Zada Girt ___________________________________________________________ Laural Golden Food Group and Servings / Food Choice Starch _________________________________________________________ Meat/Protein ___________________________________________________ Dairy __________________________________________________________ Vegetable ______________________________________________________ Fruit ___________________________________________________________ Fat ____________________________________________________________ Lollie Sails Food Group and Servings / Food Choice Fruit ___________________________________________________________ Meat/Protein ____________________________________________________ Dairy __________________________________________________________ Starch  __________________________________________________________ DAILY TOTALS Starches _________________________ Vegetables _______________________ Fruits ____________________________ Dairy ____________________________ Meat/Protein_____________________ Fats _____________________________ Document Released: 06/05/2005 Document Revised: 02/05/2012 Document Reviewed: 09/30/2009 [employer] Patient Information 2013 [place], [place].

## 2012-11-04 NOTE — Progress Notes (Signed)
  Subjective:    Patient ID: Cynthia Lee, female    DOB: February 27, 1976, 36 y.o.   MRN: [id number]  HPI Patient is a 36 year old female who presents to the clinic to followup on elevated blood pressure at last visit. Patient reports she has made every effort to decrease salt in her diet and become more active. Her blood pressure is even more elevated at today's visit. She denies any shortness of breath, chest pains, palpitations, or numbness and tingling of extremities. She has had off and on headaches for the last couple of days.  She also never followed up on elevated fasting glucose. We'll get A1c today.   Review of Systems     Objective:   Physical Exam  Constitutional: She is oriented to person, place, and time. She appears well-developed and well-nourished.       Obese  HENT:  Head: Normocephalic and atraumatic.  Cardiovascular: Normal rate and normal heart sounds.   Pulmonary/Chest: Effort normal and breath sounds normal. She has no wheezes.  Neurological: She is alert and oriented to person, place, and time.  Skin: Skin is warm and dry.  Psychiatric: She has a normal mood and affect. Her behavior is normal.          Assessment & Plan:  Hypertension-will start patient on lisinopril HCTZ today 1 tab in the morning. Encourage patient to continue making good diet choices with a low salt diet. Encouraged patient to continue with weight loss efforts. She reports once again that she will sign up for a gym membership this month. Followup in 2 months for blood pressure.  Elevated fasting glucose/diabetes type 2-A1c today was 6.8 patient is aware that anything over 6.5 is considered diabetes. I informed the patient of long term side effects of having diabetes. I made her very aware that early prevention start now and can stop the progression of diabetes. I discussed metformin and the side effects. Will start metformin 500 mg twice a day for the first 2 weeks if well tolerated she can  increase to 1000 mg twice a day. I discussed that metformin can help with weight loss as well as diabetes progression. She is aware that some people experience nausea and other GI side effects and starting metformin if she encounters these try to get a little bit of time and if persist call office so that we can consider a medication change. I did give patient a diabetic diet handout. I encouraged patient to sign up for free diabetes class that we hold monthly in the office. I also offered a nutrition referral however patient declined referral. Will followup in 6 months to recheck A1c and discuss progress. Patient is to call if she is able to tolerate metformin thousand milligrams twice a day and will call in the prescription.

## 2012-12-30 ENCOUNTER — Other Ambulatory Visit: Payer: Self-pay | Admitting: Physician Assistant

## 2013-03-08 ENCOUNTER — Other Ambulatory Visit: Payer: Self-pay | Admitting: Physician Assistant

## 2013-03-10 NOTE — Telephone Encounter (Signed)
Must make appointment 

## 2013-03-27 ENCOUNTER — Other Ambulatory Visit: Payer: Self-pay | Admitting: Physician Assistant

## 2013-03-28 NOTE — Telephone Encounter (Signed)
Needs appointment

## 2013-05-10 ENCOUNTER — Other Ambulatory Visit: Payer: Self-pay | Admitting: Physician Assistant

## 2013-05-22 ENCOUNTER — Other Ambulatory Visit: Payer: Self-pay | Admitting: Physician Assistant

## 2013-05-30 ENCOUNTER — Other Ambulatory Visit: Payer: Self-pay | Admitting: Physician Assistant

## 2013-06-01 ENCOUNTER — Other Ambulatory Visit: Payer: Self-pay | Admitting: Physician Assistant

## 2013-06-04 ENCOUNTER — Encounter: Payer: Self-pay | Admitting: Physician Assistant

## 2013-06-04 ENCOUNTER — Ambulatory Visit (INDEPENDENT_AMBULATORY_CARE_PROVIDER_SITE_OTHER): Payer: BC Managed Care – PPO | Admitting: Physician Assistant

## 2013-06-04 ENCOUNTER — Other Ambulatory Visit (HOSPITAL_COMMUNITY)
Admission: RE | Admit: 2013-06-04 | Discharge: 2013-06-04 | Disposition: A | Discharge status: Home / Self Care | Payer: BC Managed Care – PPO | Source: Ambulatory Visit | Attending: Family Medicine | Admitting: Family Medicine

## 2013-06-04 VITALS — BP 158/82 | HR 72 | Wt 349.0 lb

## 2013-06-04 DIAGNOSIS — Z1151 Encounter for screening for human papillomavirus (HPV): Secondary | ICD-10-CM | POA: Insufficient documentation

## 2013-06-04 DIAGNOSIS — N926 Irregular menstruation, unspecified: Secondary | ICD-10-CM

## 2013-06-04 DIAGNOSIS — Z01419 Encounter for gynecological examination (general) (routine) without abnormal findings: Secondary | ICD-10-CM

## 2013-06-04 DIAGNOSIS — Z124 Encounter for screening for malignant neoplasm of cervix: Secondary | ICD-10-CM

## 2013-06-04 DIAGNOSIS — E119 Type 2 diabetes mellitus without complications: Secondary | ICD-10-CM

## 2013-06-04 MED ORDER — LORCASERIN HCL 10 MG PO TABS: 10.0000 mg | ORAL_TABLET | Freq: Two times a day (BID) | ORAL | Status: DC

## 2013-06-04 MED ORDER — LISINOPRIL-HYDROCHLOROTHIAZIDE 20-12.5 MG PO TABS: 1.0000 | ORAL_TABLET | Freq: Every day | ORAL | Status: DC

## 2013-06-04 MED ORDER — AMLODIPINE BESYLATE 5 MG PO TABS: 5.0000 mg | ORAL_TABLET | Freq: Every day | ORAL | Status: DC

## 2013-06-04 MED ORDER — METFORMIN HCL 500 MG PO TABS: 1000.0000 mg | ORAL_TABLET | Freq: Two times a day (BID) | ORAL | Status: DC

## 2013-06-04 MED ORDER — OLMESARTAN-AMLODIPINE-HCTZ 20-5-12.5 MG PO TABS: ORAL_TABLET | ORAL | Status: DC

## 2013-06-04 NOTE — Progress Notes (Signed)
Subjective:    Patient ID: Cynthia Lee, female    DOB: [birth year], 37 y.o.   MRN: [id number]  HPI   Review of Systems     Objective:   Physical Exam        Assessment & Plan:   Subjective:     Cynthia Lee is a 37 y.o. female and is here for a comprehensive physical exam. The patient reports ongoing diabetes, HTN, recent missed period. .  She is a diabetic. Denies any hypogylemia symptoms, neuropathy, CP, palpitations, she is trying to stay active and make better choices. She has cut back on eating out. She has lost 4 lbs today but seems to go up and down.   HTN no CP, palpitations, SOB, headaches, vision changes. Taking medication daily.   She recently missed her period in May and June but is not sexually active or on birth control. No pain, discharge, pressure, cramps etc. She has started becoming more active and been a lot more stressed at work.   History   Social History  . Marital Status: Single    Spouse Name: N/A    Number of Children: N/A  . Years of Education: N/A   Occupational History  . Not on file.   Social History Main Topics  . Smoking status: Never Smoker   . Smokeless tobacco: Not on file  . Alcohol Use: Not on file  . Drug Use: Not on file  . Sexually Active: Not on file   Other Topics Concern  . Not on file   Social History Narrative  . No narrative on file   Health Maintenance  Topic Date Due  . Pneumococcal Polysaccharide Vaccine (#1) 07/25/1976  . Foot Exam  07/25/1984  . Ophthalmology Exam  07/25/1984  . Urine Microalbumin  07/25/1984  . Pap Smear  06/07/2011  . Influenza Vaccine  07/28/2013  . Hemoglobin A1c  12/05/2013  . Tetanus/tdap  12/08/2019    The following portions of the patient's history were reviewed and updated as appropriate: allergies, current medications, past family history, past medical history, past social history, past surgical history and problem list.  Review of Systems A comprehensive review of systems  was negative.   Objective:    BP 158/82  Pulse 72  Wt 349 lb (158.305 kg) General appearance: alert, cooperative, appears stated age and morbidly obese Head: Normocephalic, without obvious abnormality, atraumatic Eyes: conjunctivae/corneas clear. PERRL, EOM's intact. Fundi benign. Ears: normal TM's and external ear canals both ears Nose: Nares normal. Septum midline. Mucosa normal. No drainage or sinus tenderness. Throat: lips, mucosa, and tongue normal; teeth and gums normal Neck: no adenopathy, no carotid bruit, no JVD, supple, symmetrical, trachea midline and thyroid not enlarged, symmetric, no tenderness/mass/nodules Back: symmetric, no curvature. ROM normal. No CVA tenderness. Lungs: clear to auscultation bilaterally Breasts: normal appearance, no masses or tenderness Heart: regular rate and rhythm, S1, S2 normal, no murmur, click, rub or gallop Abdomen: soft, non-tender; bowel sounds normal; no masses,  no organomegaly Pelvic: cervix normal in appearance, external genitalia normal, no adnexal masses or tenderness, no cervical motion tenderness, uterus normal size, shape, and consistency and vagina normal without discharge Extremities: extremities normal, atraumatic, no cyanosis or edema Pulses: 2+ and symmetric Skin: Skin color, texture, turgor normal. No rashes or lesions Lymph nodes: Cervical, supraclavicular, and axillary nodes normal. Neurologic: Grossly normal    Assessment:    Healthy female exam.      Plan:    CPE- Pap done today. Vaccines up  to date. Will get labs in next 6 months they are also up to date. A1C is 6.3. Great job. Continue to watch diet and take metformin and exercise regularly. Gave a lot of counseling regarding weight loss. Gave sample of belviq and rx with follow up in 3 months. HTN added norvasc and just switched pt to tribenzor. Will follow up in 1 month with BP recheck. As far as missed period suspect due to stress and recent exercise. Not sexually  active will not do UPT. Pt told if period does not come and concerned to come back in to talk about it.  See After Visit Summary for Counseling Recommendations

## 2013-06-05 ENCOUNTER — Other Ambulatory Visit: Payer: Self-pay

## 2013-06-16 ENCOUNTER — Encounter: Payer: BC Managed Care – PPO | Admitting: Physician Assistant

## 2013-06-24 ENCOUNTER — Other Ambulatory Visit: Payer: Self-pay | Admitting: Physician Assistant

## 2013-09-05 ENCOUNTER — Ambulatory Visit: Payer: BC Managed Care – PPO | Admitting: Physician Assistant

## 2013-09-10 ENCOUNTER — Ambulatory Visit: Payer: BC Managed Care – PPO | Admitting: Physician Assistant

## 2013-09-12 ENCOUNTER — Ambulatory Visit: Payer: BC Managed Care – PPO | Admitting: Physician Assistant

## 2013-09-17 ENCOUNTER — Ambulatory Visit: Payer: BC Managed Care – PPO | Admitting: Physician Assistant

## 2013-09-19 ENCOUNTER — Encounter: Payer: Self-pay | Admitting: Physician Assistant

## 2013-09-19 ENCOUNTER — Ambulatory Visit (INDEPENDENT_AMBULATORY_CARE_PROVIDER_SITE_OTHER): Payer: BC Managed Care – PPO | Admitting: Physician Assistant

## 2013-09-19 VITALS — BP 148/90 | HR 97 | Wt 347.0 lb

## 2013-09-19 DIAGNOSIS — Z23 Encounter for immunization: Secondary | ICD-10-CM

## 2013-09-19 DIAGNOSIS — I10 Essential (primary) hypertension: Secondary | ICD-10-CM

## 2013-09-19 DIAGNOSIS — R790 Abnormal level of blood mineral: Secondary | ICD-10-CM

## 2013-09-19 DIAGNOSIS — E785 Hyperlipidemia, unspecified: Secondary | ICD-10-CM

## 2013-09-19 DIAGNOSIS — R7301 Impaired fasting glucose: Secondary | ICD-10-CM

## 2013-09-19 DIAGNOSIS — E119 Type 2 diabetes mellitus without complications: Secondary | ICD-10-CM

## 2013-09-19 LAB — POCT UA - MICROALBUMIN
Albumin/Creatinine Ratio, Urine, POC: <30
Microalbumin Ur, POC: 30 mg/L

## 2013-09-19 MED ORDER — OLMESARTAN-AMLODIPINE-HCTZ 20-5-12.5 MG PO TABS: ORAL_TABLET | ORAL | Status: DC

## 2013-09-19 MED ORDER — METFORMIN HCL 500 MG PO TABS: 500.0000 mg | ORAL_TABLET | Freq: Two times a day (BID) | ORAL | Status: DC

## 2013-09-19 NOTE — Progress Notes (Signed)
  Subjective:    Patient ID: Cynthia Lee, female    DOB: 1976-10-31, 37 y.o.   MRN: [id number]  HPI Patient is a 37 year old female who presents to the clinic to followup on diabetes and hypertension.  Diabetes- patient is having trouble remembering both doses of metformin. She is trying to decrease her carbohydrate intake with her diet. She admits to not keeping a very healthy diet. She also admits to not exercising regularly. She does make attempts to add walking into her regular routine. She denies any hypoglycemic events. She does not check her sugars at all. She denies any diabetic complications such as neuropathy.  Hypertension-patient has not been taking her new medication Tribenzor. She reports that the pharmacy would not fill it. When she thinks about it she has been taking the lisinopril/HCTZ that she was previously on. When she restarted the old medication she started with a dry cough. She would like to go back on the [employer]. Patient denies any chest pains, palpitations, headaches or vision changes.   Review of Systems     Objective:   Physical Exam  Constitutional: She is oriented to person, place, and time. She appears well-developed and well-nourished.  HENT:  Head: Normocephalic and atraumatic.  Cardiovascular: Normal rate, regular rhythm and normal heart sounds.   Pulmonary/Chest: Effort normal and breath sounds normal. She has no wheezes.  Neurological: She is alert and oriented to person, place, and time.  Skin: Skin is warm and dry.  Psychiatric: She has a normal mood and affect. Her behavior is normal.          Assessment & Plan:  Diabetes mellitus-patient was given flu shot today. A1c was 6.2. Patient's A1c continues to drop. Patient was encouraged to continue metformin dosage and was refilled. Patient encouraged to work on diet and exercise. Losing weight would be very beneficial. Followup in 3 months for diabetic recheck. Foot exam was done today and normal.  Urine microalbumin ratio was below 30. Ordered a CMP to look at kidney function.  Hypertension- blood pressure is not controlled today. patient reeducated that cough is likely from lisinopril. Stop lisinopril.rewrote for [employer]. : To make sure that patient is on an effective dose. Discussed with patient need to see her for followup in one month to recheck blood pressure. Encouraged low salt diet.  Screening labs of lipids were ordered.

## 2013-09-19 NOTE — Patient Instructions (Signed)
Start Tribenzor daily with Metformin twice a day.

## 2013-10-17 ENCOUNTER — Ambulatory Visit: Payer: BC Managed Care – PPO | Admitting: Physician Assistant

## 2013-10-31 ENCOUNTER — Ambulatory Visit: Payer: BC Managed Care – PPO | Admitting: Physician Assistant

## 2013-12-01 ENCOUNTER — Ambulatory Visit: Payer: BC Managed Care – PPO | Admitting: Physician Assistant

## 2013-12-01 DIAGNOSIS — Z0289 Encounter for other administrative examinations: Secondary | ICD-10-CM

## 2014-01-27 ENCOUNTER — Encounter: Payer: Self-pay | Admitting: Internal Medicine

## 2014-04-24 ENCOUNTER — Telehealth: Payer: Self-pay | Admitting: *Deleted

## 2014-04-24 DIAGNOSIS — E119 Type 2 diabetes mellitus without complications: Secondary | ICD-10-CM

## 2014-04-24 NOTE — Telephone Encounter (Signed)
Referral placed.

## 2014-04-24 NOTE — Telephone Encounter (Signed)
Ok to place referral for nutritionist.

## 2014-04-24 NOTE — Telephone Encounter (Signed)
Pt left vm asking for a referral for a nutritionist.  Are you ok with this?

## 2014-06-11 ENCOUNTER — Ambulatory Visit: Payer: BC Managed Care – PPO | Admitting: *Deleted

## 2014-07-29 ENCOUNTER — Encounter: Payer: BC Managed Care – PPO | Attending: Physician Assistant | Admitting: Dietician

## 2014-07-29 ENCOUNTER — Encounter: Payer: Self-pay | Admitting: Dietician

## 2014-07-29 VITALS — Ht 67.5 in | Wt 358.3 lb

## 2014-07-29 DIAGNOSIS — Z713 Dietary counseling and surveillance: Secondary | ICD-10-CM | POA: Diagnosis not present

## 2014-07-29 DIAGNOSIS — Z6841 Body Mass Index (BMI) 40.0 and over, adult: Secondary | ICD-10-CM | POA: Insufficient documentation

## 2014-07-29 DIAGNOSIS — E669 Obesity, unspecified: Secondary | ICD-10-CM | POA: Insufficient documentation

## 2014-07-29 DIAGNOSIS — E118 Type 2 diabetes mellitus with unspecified complications: Secondary | ICD-10-CM

## 2014-07-29 NOTE — Patient Instructions (Addendum)
Plan:  -Honor hunger and fullness cues -Try not to go more than 5 hours between eating occassions -Add a snack regularly in the afternoon that includes protein and carbohydrate - reference snack handout for ideas -Consider adding protein to breakfast - add nuts to oatmeal - 1/4 cup or peanut butter -Increase activity as able - good idea to try workout class at church

## 2014-07-29 NOTE — Progress Notes (Signed)
  Medical Nutrition Therapy:  Appt start time: 1340 end time:  9371.   Assessment:  Primary concerns today: Requested referral, wants to be healtheir. Has always been plus sized. Has type 2 diabetes. A1C -  Last one in chart - October 2014 - 6.0%. Lives with 40 year-old son and foster son -  28 years old  Health - no diet history  Preferred Learning Style:   Auditory  Visual  Hands on  No preference indicated   Learning Readiness:   Not ready  Contemplating  Ready  Change in progress   MEDICATIONS: see chart   DIETARY INTAKE:  Usual eating pattern includes 3 meals and 1-2 snacks per day.  Everyday foods include vegetables.  Avoided foods include tomatoes - allergies, fruit - doesn't like, cucumbers - doesn't like seeds, beef, pork, .    3rd grade teacher  24-hr recall:  Wake: 5:40 B (7:00-7:45 AM): Water when first wakes up, at school, milk, oatmeal - brings, or something from the school  Snk (10:30-10:45 AM): Granola bar (90 kcal), fiber one bar, fruit - brings, water  L (12:30 PM): leftovers from dinner, usually vegetables and a meat, water Snk ( PM): nothing - if there is a meeting - then will have a snack or bag of chips from the snack machine D (7:30 PM): Mon - Weds - no meat - just vegetables-fried rice ([employer]) with edemame, bell peppers, egg rolls with cabbage, black beans and corn, corn on the cob. Sunday - big dinner that mom cooks, other days a vegetable and a meat - sometimes 2 sides Snk ( PM): Nothing Beverages: water, milk, crystal light water  Eat out: Wednesdays sometimes  Hungry: none Full: Lunch  Weekends: Saturday - later breakfast - big breakfast  Usual physical activity: sometimes walks the track at recess time with the kids, teacher, sometimes walks the dog, has a pedometer and tries to take more steps then she did the day before.  Estimated energy needs: 1800 calories 200 g carbohydrates 135 g protein 50 g fat  Progress Towards  Goal(s):  No progress.   Nutritional Diagnosis:  [place]-3.3 Overweight/obesity As related to genetics, inactivity, overeating.  As evidenced by BMI greater than 30 and patient report.    Intervention:  Nutrition education and counseling.   Discussed importance of not going more than 5 hours in between meals, combining protein and carbohydrate in meals and snacks, carbohydrate sources and serving sizes. Honoring hunger and fullness cues. It seems patient is currently over eating at lunch and then often going 7 hours in between lunch and dinner.  Plan:  -Honor hunger and fullness cues -Try not to go more than 5 hours between eating occassions -Add a snack regularly in the afternoon that includes protein and carbohydrate - reference snack handout for ideas -Consider adding protein to breakfast - add nuts to oatmeal - 1/4 cup or peanut butter -Increase activity as able - good idea to try workout class at church  Teaching Method Utilized:  Visual Auditory Hands on  Handouts given during visit include:  Snack handout  Yellow card  Barriers to learning/adherence to lifestyle change: busy schedule as a teacher and mom  Demonstrated degree of understanding via:  Teach Back   Monitoring/Evaluation:  Dietary intake, exercise, carbohydrate counting, and body weight prn.

## 2014-08-26 ENCOUNTER — Telehealth: Payer: Self-pay | Admitting: Physician Assistant

## 2014-08-26 NOTE — Telephone Encounter (Signed)
It has been over one year since last follow up. We really need to test a1c and see how sugars are doing. Please make appt to do so.

## 2014-09-16 ENCOUNTER — Ambulatory Visit (INDEPENDENT_AMBULATORY_CARE_PROVIDER_SITE_OTHER): Payer: BC Managed Care – PPO | Admitting: Sports Medicine

## 2014-09-16 ENCOUNTER — Encounter: Payer: Self-pay | Admitting: Sports Medicine

## 2014-09-16 VITALS — BP 167/91 | HR 75 | Ht 67.0 in | Wt 358.0 lb

## 2014-09-16 DIAGNOSIS — M7741 Metatarsalgia, right foot: Secondary | ICD-10-CM

## 2014-09-16 NOTE — Assessment & Plan Note (Signed)
Added a metatarsal pad, and this relieved all pain. Return for custom orthotics with metatarsal pads. If symptoms persist I could surgically core out the corn.

## 2014-09-16 NOTE — Progress Notes (Signed)
   Subjective:    I'm seeing this patient as a consultation for:  Iran Planas, PA-C  CC: Right foot pain  HPI: This is a very pleasant 38 year old female with a history of well-controlled diabetes mellitus type 2 comes in with a long history of pain she localizes on the plantar aspect of her foot at approximately the third metatarsal head. Pain is moderate, persistent, not present with rest but only present with weight-bearing. 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 Pain does not radiate. No trauma. No swelling.  Past medical history, Surgical history, Family history not pertinant except as noted below, Social history, Allergies, and medications have been entered into the medical record, reviewed, and no changes needed.   Review of Systems: No headache, visual changes, nausea, vomiting, diarrhea, constipation, dizziness, abdominal pain, skin rash, fevers, chills, night sweats, weight loss, swollen lymph nodes, body aches, joint swelling, muscle aches, chest pain, shortness of breath, mood changes, visual or auditory hallucinations.   Objective:   General: Well Developed, well nourished, and in no acute distress.  Neuro/Psych: Alert and oriented x3, extra-ocular muscles intact, able to move all 4 extremities, sensation grossly intact. Skin: Warm and dry, no rashes noted.  Respiratory: Not using accessory muscles, speaking in full sentences, trachea midline.  Cardiovascular: Pulses palpable, no extremity edema. Abdomen: Does not appear distended. Right Foot: The area of tenderness to palpation corresponds to a callus/corn under her third metatarsal head. Range of motion is full in all directions. Strength is 5/5 in all directions. No hallux valgus. No pes cavus or pes planus. No abnormal callus noted. No pain over the navicular prominence, or base of fifth metatarsal. No tenderness to palpation of the calcaneal insertion of plantar fascia. No pain at the Achilles  insertion. No pain over the calcaneal bursa. No pain of the retrocalcaneal bursa. No tenderness to palpation over the tarsals, metatarsals, or phalanges. No hallux rigidus or limitus. No tenderness palpation over interphalangeal joints. No pain with compression of the metatarsal heads.  Neurovascularly intact distally. Metatarsal pad placed in her shoe which relieved all symptoms.  Impression and Recommendations:   This case required medical decision making of moderate complexity.

## 2014-10-01 ENCOUNTER — Encounter: Payer: BC Managed Care – PPO | Admitting: Sports Medicine

## 2014-10-02 ENCOUNTER — Ambulatory Visit: Payer: BC Managed Care – PPO | Admitting: Dietician

## 2014-11-02 ENCOUNTER — Encounter: Payer: BC Managed Care – PPO | Admitting: Sports Medicine

## 2015-03-26 ENCOUNTER — Encounter: Payer: Self-pay | Admitting: Physician Assistant

## 2015-03-26 ENCOUNTER — Ambulatory Visit (INDEPENDENT_AMBULATORY_CARE_PROVIDER_SITE_OTHER): Payer: BC Managed Care – PPO | Admitting: Physician Assistant

## 2015-03-26 VITALS — BP 174/90 | HR 76 | Ht 67.0 in | Wt 373.0 lb

## 2015-03-26 DIAGNOSIS — R7301 Impaired fasting glucose: Secondary | ICD-10-CM | POA: Diagnosis not present

## 2015-03-26 DIAGNOSIS — I10 Essential (primary) hypertension: Secondary | ICD-10-CM

## 2015-03-26 MED ORDER — OLMESARTAN-AMLODIPINE-HCTZ 20-5-12.5 MG PO TABS: ORAL_TABLET | ORAL | Status: DC

## 2015-03-26 NOTE — Progress Notes (Signed)
   Subjective:    Patient ID: Cynthia Lee, female    DOB: 1976/04/15, 39 y.o.   MRN: [id number]  HPI  Pt presents to the clinic to follow up on medication and to get refills of BP medication.   HTN- she has been out of medications for 2-3 weeks. Denies any CP, palpitations, SOB, headaches, or vision changes. For the last couple of days she has felt like right side of her face swelling. Denies any problems swallowing or lip swelling. No fever, chills, ear pain, cough.       Review of Systems  All other systems reviewed and are negative.      Objective:   Physical Exam  Constitutional: She is oriented to person, place, and time. She appears well-developed and well-nourished.  Morbidly obese.   HENT:  Head: Normocephalic and atraumatic.  Right Ear: External ear normal.  Left Ear: External ear normal.  Nose: Nose normal.  Mouth/Throat: Oropharynx is clear and moist. No oropharyngeal exudate.  No swelling of face noted on exam today.   Eyes: Conjunctivae are normal. Right eye exhibits no discharge. Left eye exhibits no discharge.  Neck: Normal range of motion. Neck supple.  Cardiovascular: Normal rate, regular rhythm and normal heart sounds.   Pulmonary/Chest: Effort normal and breath sounds normal. She has no wheezes.  Lymphadenopathy:    She has no cervical adenopathy.  Neurological: She is alert and oriented to person, place, and time.  Skin: Skin is dry.  Psychiatric: She has a normal mood and affect. Her behavior is normal.          Assessment & Plan:  HTN- not on medication. Restarted medication today. Will recheck in one week. Did not see any facial swelling today. She might be feeling some congestion that feels like swelling. Discussed OTC flonase. If worsening please call or follow up otherwise recheck in 1 week.   Elevated fasting glucose/borderline diabetes- needs a1c and complete dM follow up . Scheduled her for next week.

## 2015-03-31 ENCOUNTER — Ambulatory Visit (INDEPENDENT_AMBULATORY_CARE_PROVIDER_SITE_OTHER): Payer: BC Managed Care – PPO | Admitting: Physician Assistant

## 2015-03-31 ENCOUNTER — Encounter: Payer: Self-pay | Admitting: Physician Assistant

## 2015-03-31 VITALS — BP 189/90 | HR 76 | Ht 67.0 in | Wt 376.0 lb

## 2015-03-31 DIAGNOSIS — E118 Type 2 diabetes mellitus with unspecified complications: Secondary | ICD-10-CM | POA: Diagnosis not present

## 2015-03-31 DIAGNOSIS — Z0000 Encounter for general adult medical examination without abnormal findings: Secondary | ICD-10-CM | POA: Diagnosis not present

## 2015-03-31 DIAGNOSIS — Z79899 Other long term (current) drug therapy: Secondary | ICD-10-CM

## 2015-03-31 DIAGNOSIS — Z13220 Encounter for screening for lipoid disorders: Secondary | ICD-10-CM

## 2015-03-31 DIAGNOSIS — I10 Essential (primary) hypertension: Secondary | ICD-10-CM | POA: Diagnosis not present

## 2015-03-31 LAB — LIPID PANEL
CHOLESTEROL: 163 mg/dL (ref 0–200)
HDL: 42 mg/dL — ABNORMAL LOW (ref >=46)
LDL Cholesterol: 98 mg/dL (ref 0–99)
TRIGLYCERIDES: 117 mg/dL (ref <150)
Total CHOL/HDL Ratio: 3.9 Ratio
VLDL: 23 mg/dL (ref 0–40)

## 2015-03-31 LAB — COMPLETE METABOLIC PANEL WITH GFR
ALT: 9 U/L (ref 0–35)
AST: 21 U/L (ref 0–37)
Albumin: 3.7 g/dL (ref 3.5–5.2)
Alkaline Phosphatase: 68 U/L (ref 39–117)
BILIRUBIN TOTAL: 0.5 mg/dL (ref 0.2–1.2)
BUN: 8 mg/dL (ref 6–23)
CO2: 23 mEq/L (ref 19–32)
Calcium: 9.1 mg/dL (ref 8.4–10.5)
Chloride: 103 mEq/L (ref 96–112)
Creat: 0.72 mg/dL (ref 0.50–1.10)
GFR, Est African American: >89 mL/min
GFR, Est Non African American: >89 mL/min
Glucose, Bld: 119 mg/dL — ABNORMAL HIGH (ref 70–99)
Potassium: 4.1 mEq/L (ref 3.5–5.3)
Sodium: 137 mEq/L (ref 135–145)
TOTAL PROTEIN: 7.3 g/dL (ref 6.0–8.3)

## 2015-03-31 LAB — POCT UA - MICROALBUMIN
Creatinine, POC: 300 mg/dL
Microalbumin Ur, POC: 80 mg/L

## 2015-03-31 LAB — POCT GLYCOSYLATED HEMOGLOBIN (HGB A1C): Hemoglobin A1C: 7.5

## 2015-03-31 MED ORDER — DAPAGLIFLOZIN PRO-METFORMIN ER 10-1000 MG PO TB24: 1.0000 | ORAL_TABLET | Freq: Every day | ORAL | Status: DC

## 2015-03-31 NOTE — Patient Instructions (Addendum)
Keeping You Healthy  Get These Tests 1. Blood Pressure- Have your blood pressure checked once a year by your health care provider.  Normal blood pressure is 120/80. 2. Weight- Have your body mass index (BMI) calculated to screen for obesity.  BMI is measure of body fat based on height and weight.  You can also calculate your own BMI at GravelBags.it. 3. Cholesterol- Have your cholesterol checked every 5 years starting at age 41 then yearly starting at age 72. 42. Chlamydia, HIV, and other sexually transmitted diseases- Get screened every year until age 58, then within three months of each new sexual provider. 5. Pap Smear- Every 1-5 years; discuss with your health care provider. 6. Mammogram- Every 1-2 years starting at age 54--50  Take these medicines  Calcium with Vitamin D-Your body needs 1200 mg of Calcium each day and [phone] IU of Vitamin D daily.  Your body can only absorb 500 mg of Calcium at a time so Calcium must be taken in 2 or 3 divided doses throughout the day.  Multivitamin with folic acid- Once daily if it is possible for you to become pregnant.  Get these Immunizations  Gardasil-Series of three doses; prevents HPV related illness such as genital warts and cervical cancer.  Menactra-Single dose; prevents meningitis.  Tetanus shot- Every 10 years.  Flu shot-Every year.  Take these steps 1. Do not smoke-Your healthcare provider can help you quit.  For tips on how to quit go to www.smokefree.gov or call 1-800 QUITNOW. 2. Be physically active- Exercise 5 days a week for at least 30 minutes.  If you are not already physically active, start slow and gradually work up to 30 minutes of moderate physical activity.  Examples of moderate activity include walking briskly, dancing, swimming, bicycling, etc. 3. Breast Cancer- A self breast exam every month is important for early detection of breast cancer.  For more information and instruction on self breast exams, ask your  healthcare provider or https://www.patel.info/. 4. Eat a healthy diet- Eat a variety of healthy foods such as fruits, vegetables, whole grains, low fat milk, low fat cheeses, yogurt, lean meats, poultry and fish, beans, nuts, tofu, etc.  For more information go to www. Thenutritionsource.org 5. Drink alcohol in moderation- Limit alcohol intake to one drink or less per day. Never drink and drive. 6. Depression- Your emotional health is as important as your physical health.  If you're feeling down or losing interest in things you normally enjoy please talk to your healthcare provider about being screened for depression. 7. Dental visit- Brush and floss your teeth twice daily; visit your dentist twice a year. 8. Eye doctor- Get an eye exam at least every 2 years. 9. Helmet use- Always wear a helmet when riding a bicycle, motorcycle, rollerblading or skateboarding. 56. Safe sex- If you may be exposed to sexually transmitted infections, use a condom. 11. Seat belts- Seat belts can save your live; always wear one. 12. Smoke/Carbon Monoxide detectors- These detectors need to be installed on the appropriate level of your home. Replace batteries at least once a year. 13. Skin cancer- When out in the sun please cover up and use sunscreen 15 SPF or higher. 14. Violence- If anyone is threatening or hurting you, please tell your healthcare provider.       If not able to get tribenzor approved then we need to separate and change medications.  Start xigduo daily.  Will get mammogram ordered.  Will refer to bariatric.  Need to consider  pneumonia vaccine.

## 2015-04-01 LAB — HIV ANTIBODY (ROUTINE TESTING W REFLEX): HIV: NONREACTIVE

## 2015-04-01 NOTE — Progress Notes (Signed)
Subjective:     Cynthia Lee is a 39 y.o. female and is here for a comprehensive physical exam. The patient reports problems - needs refill on medications. needs DM follow up. no other concerns. .  History   Social History  . Marital Status: Single    Spouse Name: N/A  . Number of Children: N/A  . Years of Education: N/A   Occupational History  . Not on file.   Social History Main Topics  . Smoking status: Never Smoker   . Smokeless tobacco: Not on file  . Alcohol Use: Not on file  . Drug Use: Not on file  . Sexual Activity: Not on file   Other Topics Concern  . Not on file   Social History Narrative   Health Maintenance  Topic Date Due  . OPHTHALMOLOGY EXAM  07/25/1984  . MAMMOGRAM  07/25/1992  . PNEUMOCOCCAL POLYSACCHARIDE VACCINE (1) 05/31/2015 (Originally 07/25/1976)  . INFLUENZA VACCINE  06/28/2015  . HEMOGLOBIN A1C  10/01/2015  . FOOT EXAM  03/30/2016  . URINE MICROALBUMIN  03/30/2016  . PAP SMEAR  06/04/2016  . TETANUS/TDAP  12/08/2019  . HIV Screening  Completed    The following portions of the patient's history were reviewed and updated as appropriate: allergies, current medications, past family history, past medical history, past social history, past surgical history and problem list.  Review of Systems A comprehensive review of systems was negative.   Objective:    BP 189/90 mmHg  Pulse 76  Ht 5\' 7"  (1.702 m)  Wt 376 lb (170.552 kg)  BMI 58.88 kg/m2 General appearance: alert, cooperative, appears stated age and morbidly obese Head: Normocephalic, without obvious abnormality, atraumatic Eyes: conjunctivae/corneas clear. PERRL, EOM's intact. Fundi benign. Ears: normal TM's and external ear canals both ears Nose: Nares normal. Septum midline. Mucosa normal. No drainage or sinus tenderness. Throat: lips, mucosa, and tongue normal; teeth and gums normal Neck: no adenopathy, no carotid bruit, no JVD, supple, symmetrical, trachea midline and thyroid not  enlarged, symmetric, no tenderness/mass/nodules Back: symmetric, no curvature. ROM normal. No CVA tenderness. Lungs: clear to auscultation bilaterally Breasts: normal appearance, no masses or tenderness Heart: regular rate and rhythm, S1, S2 normal, no murmur, click, rub or gallop Abdomen: soft, non-tender; bowel sounds normal; no masses,  no organomegaly Pelvic: cervix normal in appearance, external genitalia normal, no adnexal masses or tenderness, no cervical motion tenderness, uterus normal size, shape, and consistency and vagina normal without discharge Extremities: extremities normal, atraumatic, no cyanosis or edema Pulses: 2+ and symmetric Skin: Skin color, texture, turgor normal. No rashes or lesions Lymph nodes: Cervical, supraclavicular, and axillary nodes normal. Neurologic: Grossly normal    Assessment:    Healthy female exam.      Plan:    CPE- fasting labs ordered. See weight loss discussion. Consider vitamin D and calcium 800units and 1200mg . Pap up to date. Normal bimanuel.   Morbid obesity- discussed how important weight loss is in overall help. Will make referral to Dr. Valetta Close for weight counseling and possible referral. Not a candidate for phentermine at this point. Discussed other medications but she just wanted the referral first.   HTN- not controlled today but not started medication. She ran into some cost issues at the pharmacy. Once gets medication needs BP recheck in 2 weeks to make sure BP controlled.   DM-... Lab Results  Component Value Date   HGBA1C 7.5 03/31/2015   Considered uncontrolled at this point. Up from 2 years ago.  Start  xigduo. Discussed possible side effects. See weight control discussion above.  Encouraged low sugars and carb diet.  Foot exam normal today. Discussed pt to get eye exam.  Pt needs pneumonia vaccine pt would like to hold off at this point.    UA micro abnormal could be due to uncontrolled BP. She is on a ARB. Will get  cmp.   Follow up in 3 months.  See After Visit Summary for Counseling Recommendations

## 2016-02-08 ENCOUNTER — Encounter: Payer: Self-pay | Admitting: Physician Assistant

## 2016-02-08 ENCOUNTER — Ambulatory Visit (INDEPENDENT_AMBULATORY_CARE_PROVIDER_SITE_OTHER): Payer: BC Managed Care – PPO | Admitting: Physician Assistant

## 2016-02-08 VITALS — BP 136/79 | HR 90 | Temp 98.3°F | Ht 67.0 in | Wt 359.0 lb

## 2016-02-08 DIAGNOSIS — I10 Essential (primary) hypertension: Secondary | ICD-10-CM | POA: Diagnosis not present

## 2016-02-08 DIAGNOSIS — J069 Acute upper respiratory infection, unspecified: Secondary | ICD-10-CM | POA: Diagnosis not present

## 2016-02-08 DIAGNOSIS — E1165 Type 2 diabetes mellitus with hyperglycemia: Secondary | ICD-10-CM

## 2016-02-08 DIAGNOSIS — IMO0001 Reserved for inherently not codable concepts without codable children: Secondary | ICD-10-CM | POA: Insufficient documentation

## 2016-02-08 MED ORDER — OLMESARTAN MEDOXOMIL 20 MG PO TABS: 20.0000 mg | ORAL_TABLET | Freq: Every day | ORAL | Status: DC

## 2016-02-08 MED ORDER — AZITHROMYCIN 250 MG PO TABS: ORAL_TABLET | ORAL | Status: DC

## 2016-02-08 NOTE — Patient Instructions (Signed)

## 2016-02-08 NOTE — Progress Notes (Signed)
   Subjective:    Patient ID: Cynthia Lee, female    DOB: July 21, 1976, 40 y.o.   MRN: [id number]  HPI  Patient is a 40 year old female who presents to the clinic with one week of upper respiratory symptoms. She has had a dry cough, sinus pressure, ear pressure, scratchy throat. She denies any fever, chills, body aches, nausea or vomiting. She is taking over-the-counter Mucinex and Delsym with little relief. She has had several sick contacts. She denies any shortness of breath or wheezing. Patient has not had insurance for the last year. She was diagnosed with diabetes and hypertension but she has not been on any medication and the last few weeks. She finally got her prescriptions filled. She did not like the way the troponins or was making her feel. She stopped this. She is taking sick do daily.   Review of Systems  All other systems reviewed and are negative.      Objective:   Physical Exam  Constitutional: She is oriented to person, place, and time. She appears well-developed and well-nourished.  obesity  HENT:  Head: Normocephalic and atraumatic.  Right Ear: External ear normal.  Left Ear: External ear normal.  Nose: Nose normal.  Mouth/Throat: Oropharynx is clear and moist. No oropharyngeal exudate.  TM's clear.  Tenderness over maxillary sinusitis.  Bilateral nasal turbinates swollen and red   Eyes: Conjunctivae are normal. Right eye exhibits no discharge. Left eye exhibits no discharge.  Neck: Normal range of motion. Neck supple. No thyromegaly present.  Cardiovascular: Normal rate, regular rhythm and normal heart sounds.   Pulmonary/Chest: Effort normal and breath sounds normal. She has no wheezes.  Lymphadenopathy:    She has no cervical adenopathy.  Neurological: She is alert and oriented to person, place, and time.  Skin: Skin is dry.  Psychiatric: She has a normal mood and affect. Her behavior is normal.          Assessment & Plan:  Acute upper respiratory  infection- zpak given. Discussed symptomatic care. HO given. Consider mucinex and flonase. Follow up as needed.   Hypertension- restart benicar 20mg  once a day. Will follow up in 2 months.   DM type II uncontrolled- just started taking medication due to not having insurance. Follow up in 2 months to check A!C.

## 2016-03-29 ENCOUNTER — Other Ambulatory Visit: Payer: Self-pay | Admitting: Physician Assistant

## 2016-11-17 ENCOUNTER — Encounter: Payer: BC Managed Care – PPO | Admitting: Physician Assistant

## 2016-12-05 ENCOUNTER — Ambulatory Visit (INDEPENDENT_AMBULATORY_CARE_PROVIDER_SITE_OTHER): Payer: BC Managed Care – PPO | Admitting: Physician Assistant

## 2016-12-05 ENCOUNTER — Encounter: Payer: Self-pay | Admitting: Physician Assistant

## 2016-12-05 ENCOUNTER — Other Ambulatory Visit (HOSPITAL_COMMUNITY)
Admission: RE | Admit: 2016-12-05 | Discharge: 2016-12-05 | Disposition: A | Discharge status: Home / Self Care | Payer: BLUE CROSS/BLUE SHIELD | Source: Ambulatory Visit | Attending: Physician Assistant | Admitting: Physician Assistant

## 2016-12-05 VITALS — BP 153/87 | HR 94 | Ht 67.0 in | Wt 361.0 lb

## 2016-12-05 DIAGNOSIS — Z79899 Other long term (current) drug therapy: Secondary | ICD-10-CM

## 2016-12-05 DIAGNOSIS — Z13220 Encounter for screening for lipoid disorders: Secondary | ICD-10-CM

## 2016-12-05 DIAGNOSIS — I10 Essential (primary) hypertension: Secondary | ICD-10-CM

## 2016-12-05 DIAGNOSIS — I8390 Asymptomatic varicose veins of unspecified lower extremity: Secondary | ICD-10-CM

## 2016-12-05 DIAGNOSIS — Z1231 Encounter for screening mammogram for malignant neoplasm of breast: Secondary | ICD-10-CM | POA: Diagnosis not present

## 2016-12-05 DIAGNOSIS — K580 Irritable bowel syndrome with diarrhea: Secondary | ICD-10-CM

## 2016-12-05 DIAGNOSIS — Z113 Encounter for screening for infections with a predominantly sexual mode of transmission: Secondary | ICD-10-CM | POA: Diagnosis present

## 2016-12-05 DIAGNOSIS — E559 Vitamin D deficiency, unspecified: Secondary | ICD-10-CM

## 2016-12-05 DIAGNOSIS — Z1151 Encounter for screening for human papillomavirus (HPV): Secondary | ICD-10-CM | POA: Insufficient documentation

## 2016-12-05 DIAGNOSIS — E119 Type 2 diabetes mellitus without complications: Secondary | ICD-10-CM

## 2016-12-05 DIAGNOSIS — Z862 Personal history of diseases of the blood and blood-forming organs and certain disorders involving the immune mechanism: Secondary | ICD-10-CM

## 2016-12-05 DIAGNOSIS — D509 Iron deficiency anemia, unspecified: Secondary | ICD-10-CM

## 2016-12-05 DIAGNOSIS — Z01419 Encounter for gynecological examination (general) (routine) without abnormal findings: Secondary | ICD-10-CM | POA: Diagnosis not present

## 2016-12-05 DIAGNOSIS — B372 Candidiasis of skin and nail: Secondary | ICD-10-CM | POA: Insufficient documentation

## 2016-12-05 DIAGNOSIS — Z0000 Encounter for general adult medical examination without abnormal findings: Secondary | ICD-10-CM

## 2016-12-05 DIAGNOSIS — Z131 Encounter for screening for diabetes mellitus: Secondary | ICD-10-CM

## 2016-12-05 MED ORDER — OLMESARTAN MEDOXOMIL 20 MG PO TABS: 20.0000 mg | ORAL_TABLET | Freq: Every day | ORAL | 5 refills | Status: DC

## 2016-12-05 MED ORDER — NYSTATIN-TRIAMCINOLONE 100000-0.1 UNIT/GM-% EX OINT: 1.0000 "application " | TOPICAL_OINTMENT | Freq: Two times a day (BID) | CUTANEOUS | 0 refills | Status: DC

## 2016-12-05 MED ORDER — FERROUS SULFATE 325 (65 FE) MG PO TABS: 325.0000 mg | ORAL_TABLET | Freq: Every day | ORAL | 5 refills | Status: AC

## 2016-12-05 MED ORDER — DAPAGLIFLOZIN PRO-METFORMIN ER 10-1000 MG PO TB24: 1.0000 | ORAL_TABLET | Freq: Every day | ORAL | 2 refills | Status: DC

## 2016-12-05 NOTE — Progress Notes (Addendum)
Subjective:     Cynthia Lee is a 41 y.o. female and is here for a comprehensive physical exam. The patient reports she noticed a rash on her buttock, underneath her breasts, and neck last Thursday after using Zest bodywash.  Patient reports the rash has resolved underneath her breasts and around her neck with the use of lotion.  Patient states the rash on her buttock are dry bumps, and she reports she has switched to cotton underwear and using Andy's ointment.  Patient reports it is painful and irritated especially after wiping. .  Patient also reports she has been having diarrhea 3-4 times per day for the past 2 months.  She report it is usually after eating or even after smelling food.  Patient denies weight loss or recent antibiotic use.  Patient reports she experiences occasional cramping and sharp pains before having a bowel movement.  Patient reports she has mild nausea but not vomiting.  Denies any blood in stool or evidence of hemorrhoids.  Patient denies a history of anything like this in the past.  Patient reports the only abdominal surgeries she has had was a cholecystectomy.    Patient states she has not received a mammogram yet, but agrees to getting one.  Patient declined a flu shot.  Patient agrees to getting a pap smear today (last one in 2014).  Patient is not fasting today, but agrees to come back for routine labs and an A1c check.  Patient denies daily blood glucose checks.  Patient reports she received an eye exam this past year and a diabetic foot exam at last visit.  Patient declines the pneumonia vaccine today.     Patient reports she has been out of her BP and diabetes medications since the beginning of December.  She needs a refill of both.     Social History   Social History  . Marital status: Single    Spouse name: N/A  . Number of children: N/A  . Years of education: N/A   Occupational History  . Not on file.   Social History Main Topics  . Smoking status: Never  Smoker  . Smokeless tobacco: Not on file  . Alcohol use Not on file  . Drug use: Unknown  . Sexual activity: Not on file   Other Topics Concern  . Not on file   Social History Narrative  . No narrative on file   Health Maintenance  Topic Date Due  . PNEUMOCOCCAL POLYSACCHARIDE VACCINE (1) 07/25/1976  . OPHTHALMOLOGY EXAM  07/25/1984  . MAMMOGRAM  07/25/1992  . HEMOGLOBIN A1C  10/01/2015  . FOOT EXAM  03/30/2016  . PAP SMEAR  06/04/2016  . INFLUENZA VACCINE  06/27/2016  . TETANUS/TDAP  12/08/2019  . HIV Screening  Completed    The following portions of the patient's history were reviewed and updated as appropriate: allergies, current medications, past family history, past medical history, past social history, past surgical history and problem list.  Review of Systems Pertinent items are noted in HPI.   Objective:    BP (!) 153/87   Pulse 94   Ht 5\' 7"  (1.702 m)   Wt (!) 361 lb (163.7 kg)   BMI 56.54 kg/m  General appearance: alert and cooperative morbidly obese.  Head: Normocephalic, without obvious abnormality, atraumatic, sinuses nontender to percussion Eyes: conjunctivae/corneas clear. PERRL, EOM's intact. Fundi benign. Ears: normal TM's and external ear canals both ears Nose: Nares normal. Septum midline. Mucosa normal. No drainage or sinus tenderness. Throat:  Poor denition of back molars Neck: no adenopathy, supple, symmetrical, trachea midline and thyroid not enlarged, symmetric, no tenderness/mass/nodules Lungs: clear to auscultation bilaterally Heart: regular rate and rhythm, S1, S2 normal, no murmur, click, rub or gallop Abdomen: soft, non-tender; bowel sounds normal; no masses,  no organomegaly Pelvic: cervix normal in appearance, external genitalia normal, rectovaginal septum normal, uterus normal size, shape, and consistency and vagina normal without discharge Extremities: no edema, redness or tenderness in the calves or thighs, no ulcers, gangrene or  trophic changes and varicose veins noted Nonpitting edema of lower extremities bilaterally. Pulses: 2+ and symmetric Skin: Rash noted in buttock region.  Signs of scaly and lesions at different stages of healing.  Appears moist. Neurologic: Alert and oriented X 3, normal strength and tone. Normal symmetric reflexes. Normal coordination and gait Reflexes: 2+ and symmetric    Assessment:    Healthy female exam. See plan below.     Plan:    [name] KitchenMarland KitchenZenia was seen today for annual exam and gynecologic exam.  Diagnoses and all orders for this visit:  Routine physical examination  Encounter for gynecological examination without abnormal finding -     Cytology - PAP  Visit for screening mammogram -     MM DIGITAL SCREENING BILATERAL; Future  Controlled type 2 diabetes mellitus without complication, without long-term current use of insulin (HCC) -     Hemoglobin A1c  Screening for diabetes mellitus  Medication management -     COMPLETE METABOLIC PANEL WITH GFR  Screening for lipid disorders -     Lipid panel  Vitamin D deficiency -     VITAMIN D 25 Hydroxy (Vit-D Deficiency, Fractures)  Iron deficiency anemia, unspecified iron deficiency anemia type -     CBC with Differential/Platelet  Yeast infection of the skin -     nystatin-triamcinolone ointment (MYCOLOG); Apply 1 application topically 2 (two) times daily. External use only.  Irritable bowel syndrome with diarrhea    See After Visit Summary for Counseling Recommendations    Patient was informed to continue taking vitamin D and iron supplementation at l.  A CBC was ordered to monitor her anemia.    Patient's referral was made to receive a screening mammogram.    Restart BP and metformin. BP elevated due to not taking medication.   Patient's rash on her buttocks appears to be a yeast infection.  Patient instructed to apply Nystatin-triammcinolone twice daily to area.  Patient informed it is for external use only.  Discussed importance of keeping area dry.   Dicussed with patient that her diarrhea is likely due to IBS since she does not have any red flag symptoms, such as weight loss, blood in stool, recent antibiotic use, severe abdominal pain, or peritoneal signs.  Instructed patient to take fiber and probiotics daily.  Dicussed introducing a medication for her diarrhea if she has not improved by her follow-up visit in 3 months.

## 2016-12-05 NOTE — Patient Instructions (Signed)
Skin Yeast Infection Skin yeast infection is a condition in which there is an overgrowth of yeast (candida) that normally lives on the skin. This condition usually occurs in areas of the skin that are constantly warm and moist, such as the armpits or the groin. What are the causes? This condition is caused by a change in the normal balance of the yeast and bacteria that live on the skin. What increases the risk? This condition is more likely to develop in:  People who are obese.  Pregnant women.  Women who take birth control pills.  People who have diabetes.  People who take antibiotic medicines.  People who take steroid medicines.  People who are malnourished.  People who have a weak defense (immune) system.  People who are 11 years of age or older. What are the signs or symptoms? Symptoms of this condition include:  A red, swollen area of the skin.  Bumps on the skin.  Itchiness. How is this diagnosed? This condition is diagnosed with a medical history and physical exam. Your health care provider may check for yeast by taking light scrapings of the skin to be viewed under a microscope. How is this treated? This condition is treated with medicine. Medicines may be prescribed or be available over-the-counter. The medicines may be:  Taken by mouth (orally).  Applied as a cream. Follow these instructions at home:  Take or apply over-the-counter and prescription medicines only as told by your health care provider.  Eat more yogurt. This may help to keep your yeast infection from returning.  Maintain a healthy weight. If you need help losing weight, talk with your health care provider.  Keep your skin clean and dry.  If you have diabetes, keep your blood sugar under control. Contact a health care provider if:  Your symptoms go away and then return.  Your symptoms do not get better with treatment.  Your symptoms get worse.  Your rash spreads.  You have a fever  or chills.  You have new symptoms.  You have new warmth or redness of your skin. This information is not intended to replace advice given to you by your health care provider. Make sure you discuss any questions you have with your health care provider. Document Released: 08/01/2011 Document Revised: 07/09/2016 Document Reviewed: 05/17/2015 Elsevier Interactive Patient Education  2017 [employer]  Get These Tests 1. Blood Pressure- Have your blood pressure checked once a year by your health care provider.  Normal blood pressure is 120/80. 2. Weight- Have your body mass index (BMI) calculated to screen for obesity.  BMI is measure of body fat based on height and weight.  You can also calculate your own BMI at [employer]. 3. Cholesterol- Have your cholesterol checked every 5 years starting at age 2 then yearly starting at age 28. 72. Chlamydia, HIV, and other sexually transmitted diseases- Get screened every year until age 41, then within three months of each new sexual provider. 5. Pap Test - Every 1-5 years; discuss with your health care provider. 6. Mammogram- Every 1-2 years starting at age 45--50  Take these medicines  Calcium with Vitamin D-Your body needs 1200 mg of Calcium each day and [phone] IU of Vitamin D daily.  Your body can only absorb 500 mg of Calcium at a time so Calcium must be taken in 2 or 3 divided doses throughout the day.  Multivitamin with folic acid- Once daily if it is possible for you to become pregnant.  Get these Immunizations  Gardasil-Series of three doses; prevents HPV related illness such as genital warts and cervical cancer.  Menactra-Single dose; prevents meningitis.  Tetanus shot- Every 10 years.  Flu shot-Every year.  Take these steps 1. Do not smoke-Your healthcare provider can help you quit.  For tips on how to quit go to www.smokefree.gov or call 1-800 QUITNOW. 2. Be physically active- Exercise 5 days a  week for at least 30 minutes.  If you are not already physically active, start slow and gradually work up to 30 minutes of moderate physical activity.  Examples of moderate activity include walking briskly, dancing, swimming, bicycling, etc. 3. Breast Cancer- A self breast exam every month is important for early detection of breast cancer.  For more information and instruction on self breast exams, ask your healthcare provider or https://www.patel.info/. 4. Eat a healthy diet- Eat a variety of healthy foods such as fruits, vegetables, whole grains, low fat milk, low fat cheeses, yogurt, lean meats, poultry and fish, beans, nuts, tofu, etc.  For more information go to www. Thenutritionsource.org 5. Drink alcohol in moderation- Limit alcohol intake to one drink or less per day. Never drink and drive. 6. Depression- Your emotional health is as important as your physical health.  If you're feeling down or losing interest in things you normally enjoy please talk to your healthcare provider about being screened for depression. 7. Dental visit- Brush and floss your teeth twice daily; visit your dentist twice a year. 8. Eye doctor- Get an eye exam at least every 2 years. 9. Helmet use- Always wear a helmet when riding a bicycle, motorcycle, rollerblading or skateboarding. 69. Safe sex- If you may be exposed to sexually transmitted infections, use a condom. 11. Seat belts- Seat belts can save your live; always wear one. 12. Smoke/Carbon Monoxide detectors- These detectors need to be installed on the appropriate level of your home. Replace batteries at least once a year. 13. Skin cancer- When out in the sun please cover up and use sunscreen 15 SPF or higher. 14. Violence- If anyone is threatening or hurting you, please tell your healthcare provider.

## 2016-12-06 LAB — CYTOLOGY - PAP
BACTERIAL VAGINITIS: NEGATIVE
Candida vaginitis: POSITIVE — AB
Chlamydia: NEGATIVE
Diagnosis: NEGATIVE
HPV: NOT DETECTED
Neisseria Gonorrhea: NEGATIVE
TRICH (WINDOWPATH): NEGATIVE

## 2016-12-07 ENCOUNTER — Other Ambulatory Visit: Payer: Self-pay | Admitting: *Deleted

## 2016-12-07 MED ORDER — FLUCONAZOLE 150 MG PO TABS: 150.0000 mg | ORAL_TABLET | Freq: Once | ORAL | 0 refills | Status: DC

## 2016-12-27 ENCOUNTER — Ambulatory Visit: Payer: BC Managed Care – PPO

## 2017-01-02 ENCOUNTER — Ambulatory Visit: Payer: BC Managed Care – PPO

## 2017-01-03 ENCOUNTER — Ambulatory Visit: Payer: BC Managed Care – PPO

## 2017-01-04 LAB — HM DIABETES EYE EXAM

## 2017-03-05 ENCOUNTER — Ambulatory Visit: Payer: BC Managed Care – PPO | Admitting: Physician Assistant

## 2017-03-06 ENCOUNTER — Ambulatory Visit: Payer: BLUE CROSS/BLUE SHIELD | Admitting: Physician Assistant

## 2017-03-19 ENCOUNTER — Ambulatory Visit: Payer: BLUE CROSS/BLUE SHIELD | Admitting: Physician Assistant

## 2017-03-23 ENCOUNTER — Ambulatory Visit: Payer: BLUE CROSS/BLUE SHIELD | Admitting: Physician Assistant

## 2017-06-28 LAB — HM DIABETES EYE EXAM

## 2018-05-17 ENCOUNTER — Ambulatory Visit (INDEPENDENT_AMBULATORY_CARE_PROVIDER_SITE_OTHER): Payer: BC Managed Care – PPO | Admitting: Physician Assistant

## 2018-05-17 VITALS — BP 181/92 | HR 75 | Ht 67.0 in | Wt 342.0 lb

## 2018-05-17 DIAGNOSIS — Z113 Encounter for screening for infections with a predominantly sexual mode of transmission: Secondary | ICD-10-CM | POA: Diagnosis not present

## 2018-05-17 DIAGNOSIS — Z1231 Encounter for screening mammogram for malignant neoplasm of breast: Secondary | ICD-10-CM | POA: Diagnosis not present

## 2018-05-17 DIAGNOSIS — E782 Mixed hyperlipidemia: Secondary | ICD-10-CM

## 2018-05-17 DIAGNOSIS — E1165 Type 2 diabetes mellitus with hyperglycemia: Secondary | ICD-10-CM | POA: Diagnosis not present

## 2018-05-17 DIAGNOSIS — Z01419 Encounter for gynecological examination (general) (routine) without abnormal findings: Secondary | ICD-10-CM

## 2018-05-17 DIAGNOSIS — I10 Essential (primary) hypertension: Secondary | ICD-10-CM

## 2018-05-17 DIAGNOSIS — Z0000 Encounter for general adult medical examination without abnormal findings: Secondary | ICD-10-CM | POA: Diagnosis not present

## 2018-05-17 DIAGNOSIS — IMO0001 Reserved for inherently not codable concepts without codable children: Secondary | ICD-10-CM

## 2018-05-17 MED ORDER — OLMESARTAN MEDOXOMIL 20 MG PO TABS: 20.0000 mg | ORAL_TABLET | Freq: Every day | ORAL | 1 refills | Status: DC

## 2018-05-17 MED ORDER — DAPAGLIFLOZIN PRO-METFORMIN ER 10-1000 MG PO TB24: 1.0000 | ORAL_TABLET | Freq: Every day | ORAL | 2 refills | Status: DC

## 2018-05-17 NOTE — Progress Notes (Addendum)
Subjective:    Patient ID: Cynthia Lee, female    DOB: 07-Aug-1976, 42 y.o.   MRN: [id number]  HPI Patient is a 42 yo female with a pmhx of HTN, T2DM, varicose veins, hyperlipidemia, and IBS presenting to the clinic today for yearly exam. She has not been taking her medications or checking her sugars. She has no complaints and states she feels great. She is exercising 3-4 days per week for approximately 1 hour. She has been watching her diet and denies overeating. She states she has been trying to control her medical problems with diet and exercise.  Review of Systems  All other systems reviewed and are negative.  .. Active Ambulatory Problems    Diagnosis Date Noted  . History of anemia 10/09/2012  . Obesity 10/09/2012  . Varicose vein of leg 10/09/2012  . Abnormal Pap smear, atypical squamous cells of undetermined sign (ASC-US) 10/31/2012  . Elevated fasting glucose 11/04/2012  . Benign essential hypertension 11/04/2012  . Metatarsalgia with corn of right foot 09/16/2014  . Morbid obesity ([place]) 03/31/2015  . Uncontrolled type 2 diabetes mellitus without complication, without long-term current use of insulin ([place]) 02/08/2016  . Yeast infection of the skin 12/05/2016  . Irritable bowel syndrome with diarrhea 12/05/2016   Resolved Ambulatory Problems    Diagnosis Date Noted  . No Resolved Ambulatory Problems   Past Medical History:  Diagnosis Date  . Abnormal Pap smear, atypical squamous cells of undetermined sign (ASC-US)   . Anemia   . Diabetes mellitus without complication ([place])    .[name] Kitchen Family History  Problem Relation Age of Onset  . Hyperlipidemia Mother   . Hypertension Mother   . Alcohol abuse Father   . Diabetes Father   . Hypertension Father   . Hyperlipidemia Father   . Heart attack Father   . Diabetes Son   . Stroke Paternal Grandmother   . Cancer Paternal Grandmother        ovarian cancer       Objective:   Physical Exam  Constitutional: She is  oriented to person, place, and time. She appears well-developed and well-nourished. No distress.  HENT:  Head: Normocephalic and atraumatic.  Eyes: Pupils are equal, round, and reactive to light. EOM are normal.  Neck: Normal range of motion.  Cardiovascular: Normal rate and regular rhythm. Exam reveals no gallop and no friction rub.  No murmur heard. Pulmonary/Chest: Effort normal and breath sounds normal.  Abdominal: Soft. Bowel sounds are normal. She exhibits no mass. There is no guarding.  Sensitive to palpation in the epigastric region  Neurological: She is alert and oriented to person, place, and time.  Skin: Skin is warm and dry. She is not diaphoretic.  Psychiatric: She has a normal mood and affect. Her behavior is normal.  Vitals reviewed.  .. Vitals:   05/17/18 0922  BP: (!) 181/92  Pulse: 75  SpO2: 95%   .[name] Kitchen Depression screen [facility] 2/9 05/17/2018  Decreased Interest 0  Down, Depressed, Hopeless 0  PHQ - 2 Score 0  Altered sleeping 2  Tired, decreased energy 2  Change in appetite 1  Feeling bad or failure about yourself  0  Trouble concentrating 0  Moving slowly or fidgety/restless 0  Suicidal thoughts 0  PHQ-9 Score 5  Difficult doing work/chores Not difficult at all   .[name] Kitchen GAD 7 : Generalized Anxiety Score 05/17/2018  Nervous, Anxious, on Edge 1  Control/stop worrying 0  Worry too much - different things 1  Trouble relaxing 0  Restless 1  Easily annoyed or irritable 0  Afraid - awful might happen 0  Total GAD 7 Score 3  Anxiety Difficulty Not difficult at all       Assessment & Plan:  [name] KitchenMarland KitchenCathie was seen today for annual exam.  Diagnoses and all orders for this visit:  Encounter for gynecological examination without abnormal finding -     Cytology - PAP  Screening examination for STD (sexually transmitted disease) -     C. trachomatis/N. gonorrhoeae RNA  Benign essential hypertension -     COMPLETE METABOLIC PANEL WITH GFR -     olmesartan (BENICAR)  20 MG tablet; Take 1 tablet (20 mg total) by mouth daily.  Uncontrolled type 2 diabetes mellitus without complication, without long-term current use of insulin (HCC) -     Hemoglobin A1c -     COMPLETE METABOLIC PANEL WITH GFR -     Dapagliflozin-metFORMIN HCl ER (XIGDUO XR) 08-999 MG TB24; Take 1 tablet by mouth daily.  Morbid obesity ([place])  Routine physical examination -     MM 3D SCREEN BREAST BILATERAL -     Hemoglobin A1c -     COMPLETE METABOLIC PANEL WITH GFR -     Lipid Panel w/reflex Direct LDL -     CBC -     TSH -     VITAMIN D 25 Hydroxy (Vit-D Deficiency, Fractures)  Visit for screening mammogram -     MM 3D SCREEN BREAST BILATERAL  Mixed hyperlipidemia -     Lipid Panel w/reflex Direct LDL  .[name] Kitchen Depression screen [facility] 2/9 05/17/2018  Decreased Interest 0  Down, Depressed, Hopeless 0  PHQ - 2 Score 0  Altered sleeping 2  Tired, decreased energy 2  Change in appetite 1  Feeling bad or failure about yourself  0  Trouble concentrating 0  Moving slowly or fidgety/restless 0  Suicidal thoughts 0  PHQ-9 Score 5  Difficult doing work/chores Not difficult at all   .[name] Kitchen Discussed 150 minutes of exercise a week.  Encouraged vitamin D 1000 units and Calcium 1300mg  or 4 servings of dairy a day.   PAP - discussed with patient that her abnormal PAP was greater than 10 years ago and she has had several normal PAPs since. Agreed PAP screening timeline can be increased and declined PAP today.  HTN - BP is very high today. Discussed the importance of taking her BP medication. Discussed at home BP monitoring.  T2DM and Weight - Encouraged continued exercise and weight loss. Discussed potentially starting Ozempic in the future to assist with weight loss efforts and diabetes management. She denies a history of pancreatitis or a family history of thyroid cancer. We will see how labs look today. Checking CMP and HgbA1c. Emphasized the importance of taking diabetes medications and  checking her glucose levels at home.  Screening labs today. Mammogram ordered.

## 2018-05-17 NOTE — Patient Instructions (Addendum)
Keeping You Healthy  Get These Tests 1. Blood Pressure- Have your blood pressure checked once a year by your health care provider.  Normal blood pressure is 120/80. 2. Weight- Have your body mass index (BMI) calculated to screen for obesity.  BMI is measure of body fat based on height and weight.  You can also calculate your own BMI at [employer]. 3. Cholesterol- Have your cholesterol checked every 5 years starting at age 44 then yearly starting at age 44. 7. Chlamydia, HIV, and other sexually transmitted diseases- Get screened every year until age 30, then within three months of each new sexual provider. 5. Pap Test - Every 1-5 years; discuss with your health care provider. 6. Mammogram- Every 1-2 years starting at age 44--50  Take these medicines  Calcium with Vitamin D-Your body needs 1200 mg of Calcium each day and [phone] IU of Vitamin D daily.  Your body can only absorb 500 mg of Calcium at a time so Calcium must be taken in 2 or 3 divided doses throughout the day.  Multivitamin with folic acid- Once daily if it is possible for you to become pregnant.  Get these Immunizations  Tetanus shot- Every 10 years.  Flu shot-Every year.  Take these steps 1. Do not smoke-Your healthcare provider can help you quit.  For tips on how to quit go to www.smokefree.gov or call 1-800 QUITNOW. 2. Be physically active- Exercise 5 days a week for at least 30 minutes.  If you are not already physically active, start slow and gradually work up to 30 minutes of moderate physical activity.  Examples of moderate activity include walking briskly, dancing, swimming, bicycling, etc. 3. Breast Cancer- A self breast exam every month is important for early detection of breast cancer.  For more information and instruction on self breast exams, ask your healthcare provider or https://www.patel.info/. 4. Eat a healthy diet- Eat a variety of healthy foods such as fruits, vegetables,  whole grains, low fat milk, low fat cheeses, yogurt, lean meats, poultry and fish, beans, nuts, tofu, etc.  For more information go to www. Thenutritionsource.org 5. Drink alcohol in moderation- Limit alcohol intake to one drink or less per day. Never drink and drive. 6. Depression- Your emotional health is as important as your physical health.  If you're feeling down or losing interest in things you normally enjoy please talk to your healthcare provider about being screened for depression. 7. Dental visit- Brush and floss your teeth twice daily; visit your dentist twice a year. 8. Eye doctor- Get an eye exam at least every 2 years. 9. Helmet use- Always wear a helmet when riding a bicycle, motorcycle, rollerblading or skateboarding. 68. Safe sex- If you may be exposed to sexually transmitted infections, use a condom. 11. Seat belts- Seat belts can save your live; always wear one. 12. Smoke/Carbon Monoxide detectors- These detectors need to be installed on the appropriate level of your home. Replace batteries at least once a year. 13. Skin cancer- When out in the sun please cover up and use sunscreen 15 SPF or higher. 14. Violence- If anyone is threatening or hurting you, please tell your healthcare provider.       Health Maintenance, Female Adopting a healthy lifestyle and getting preventive care can go a long way to promote health and wellness. Talk with your health care provider about what schedule of regular examinations is right for you. This is a good chance for you to check in with your provider about disease prevention  and staying healthy. In between checkups, there are plenty of things you can do on your own. Experts have done a lot of research about which lifestyle changes and preventive measures are most likely to keep you healthy. Ask your health care provider for more information. Weight and diet Eat a healthy diet  Be sure to include plenty of vegetables, fruits, low-fat dairy  products, and lean protein.  Do not eat a lot of foods high in solid fats, added sugars, or salt.  Get regular exercise. This is one of the most important things you can do for your health. ? Most adults should exercise for at least 150 minutes each week. The exercise should increase your heart rate and make you sweat (moderate-intensity exercise). ? Most adults should also do strengthening exercises at least twice a week. This is in addition to the moderate-intensity exercise.  Maintain a healthy weight  Body mass index (BMI) is a measurement that can be used to identify possible weight problems. It estimates body fat based on height and weight. Your health care provider can help determine your BMI and help you achieve or maintain a healthy weight.  For females 50 years of age and older: ? A BMI below 18.5 is considered underweight. ? A BMI of 18.5 to 24.9 is normal. ? A BMI of 25 to 29.9 is considered overweight. ? A BMI of 30 and above is considered obese.  Watch levels of cholesterol and blood lipids  You should start having your blood tested for lipids and cholesterol at 44 years of age, then have this test every 5 years.  You may need to have your cholesterol levels checked more often if: ? Your lipid or cholesterol levels are high. ? You are older than 44 years of age. ? You are at high risk for heart disease.  Cancer screening Lung Cancer  Lung cancer screening is recommended for adults 44-56 years old who are at high risk for lung cancer because of a history of smoking.  A yearly low-dose CT scan of the lungs is recommended for people who: ? Currently smoke. ? Have quit within the past 44 years. ? Have at least a 30-pack-year history of smoking. A pack year is smoking an average of one pack of cigarettes a day for 1 year.  Yearly screening should continue until it has been 15 years since you quit.  Yearly screening should stop if you develop a health problem that would  prevent you from having lung cancer treatment.  Breast Cancer  Practice breast self-awareness. This means understanding how your breasts normally appear and feel.  It also means doing regular breast self-exams. Let your health care provider know about any changes, no matter how small.  If you are in your 20s or 30s, you should have a clinical breast exam (CBE) by a health care provider every 1-3 years as part of a regular health exam.  If you are 24 or older, have a CBE every year. Also consider having a breast X-ray (mammogram) every year.  If you have a family history of breast cancer, talk to your health care provider about genetic screening.  If you are at high risk for breast cancer, talk to your health care provider about having an MRI and a mammogram every year.  Breast cancer gene (BRCA) assessment is recommended for women who have family members with BRCA-related cancers. BRCA-related cancers include: ? Breast. ? Ovarian. ? Tubal. ? Peritoneal cancers.  Results of the assessment  will determine the need for genetic counseling and BRCA1 and BRCA2 testing.  Cervical Cancer Your health care provider may recommend that you be screened regularly for cancer of the pelvic organs (ovaries, uterus, and vagina). This screening involves a pelvic examination, including checking for microscopic changes to the surface of your cervix (Pap test). You may be encouraged to have this screening done every 3 years, beginning at age 40.  For women ages 40-65, health care providers may recommend pelvic exams and Pap testing every 3 years, or they may recommend the Pap and pelvic exam, combined with testing for human papilloma virus (HPV), every 5 years. Some types of HPV increase your risk of cervical cancer. Testing for HPV may also be done on women of any age with unclear Pap test results.  Other health care providers may not recommend any screening for nonpregnant women who are considered low risk  for pelvic cancer and who do not have symptoms. Ask your health care provider if a screening pelvic exam is right for you.  If you have had past treatment for cervical cancer or a condition that could lead to cancer, you need Pap tests and screening for cancer for at least 20 years after your treatment. If Pap tests have been discontinued, your risk factors (such as having a new sexual partner) need to be reassessed to determine if screening should resume. Some women have medical problems that increase the chance of getting cervical cancer. In these cases, your health care provider may recommend more frequent screening and Pap tests.  Colorectal Cancer  This type of cancer can be detected and often prevented.  Routine colorectal cancer screening usually begins at 44 years of age and continues through 44 years of age.  Your health care provider may recommend screening at an earlier age if you have risk factors for colon cancer.  Your health care provider may also recommend using home test kits to check for hidden blood in the stool.  A small camera at the end of a tube can be used to examine your colon directly (sigmoidoscopy or colonoscopy). This is done to check for the earliest forms of colorectal cancer.  Routine screening usually begins at age 39.  Direct examination of the colon should be repeated every 5-10 years through 44 years of age. However, you may need to be screened more often if early forms of precancerous polyps or small growths are found.  Skin Cancer  Check your skin from head to toe regularly.  Tell your health care provider about any new moles or changes in moles, especially if there is a change in a mole's shape or color.  Also tell your health care provider if you have a mole that is larger than the size of a pencil eraser.  Always use sunscreen. Apply sunscreen liberally and repeatedly throughout the day.  Protect yourself by wearing long sleeves, pants, a  wide-brimmed hat, and sunglasses whenever you are outside.  Heart disease, diabetes, and high blood pressure  High blood pressure causes heart disease and increases the risk of stroke. High blood pressure is more likely to develop in: ? People who have blood pressure in the high end of the normal range (130-139/85-89 mm Hg). ? People who are overweight or obese. ? People who are African American.  If you are 54-73 years of age, have your blood pressure checked every 3-5 years. If you are 72 years of age or older, have your blood pressure checked every year. You  should have your blood pressure measured twice-once when you are at a hospital or clinic, and once when you are not at a hospital or clinic. Record the average of the two measurements. To check your blood pressure when you are not at a hospital or clinic, you can use: ? An automated blood pressure machine at a pharmacy. ? A home blood pressure monitor.  If you are between 57 years and 85 years old, ask your health care provider if you should take aspirin to prevent strokes.  Have regular diabetes screenings. This involves taking a blood sample to check your fasting blood sugar level. ? If you are at a normal weight and have a low risk for diabetes, have this test once every three years after 44 years of age. ? If you are overweight and have a high risk for diabetes, consider being tested at a younger age or more often. Preventing infection Hepatitis B  If you have a higher risk for hepatitis B, you should be screened for this virus. You are considered at high risk for hepatitis B if: ? You were born in a country where hepatitis B is common. Ask your health care provider which countries are considered high risk. ? Your parents were born in a high-risk country, and you have not been immunized against hepatitis B (hepatitis B vaccine). ? You have HIV or AIDS. ? You use needles to inject street drugs. ? You live with someone who has  hepatitis B. ? You have had sex with someone who has hepatitis B. ? You get hemodialysis treatment. ? You take certain medicines for conditions, including cancer, organ transplantation, and autoimmune conditions.  Hepatitis C  Blood testing is recommended for: ? Everyone born from [birth year] through 1967. ? Anyone with known risk factors for hepatitis C.  Sexually transmitted infections (STIs)  You should be screened for sexually transmitted infections (STIs) including gonorrhea and chlamydia if: ? You are sexually active and are younger than 44 years of age. ? You are older than 44 years of age and your health care provider tells you that you are at risk for this type of infection. ? Your sexual activity has changed since you were last screened and you are at an increased risk for chlamydia or gonorrhea. Ask your health care provider if you are at risk.  If you do not have HIV, but are at risk, it may be recommended that you take a prescription medicine daily to prevent HIV infection. This is called pre-exposure prophylaxis (PrEP). You are considered at risk if: ? You are sexually active and do not regularly use condoms or know the HIV status of your partner(s). ? You take drugs by injection. ? You are sexually active with a partner who has HIV.  Talk with your health care provider about whether you are at high risk of being infected with HIV. If you choose to begin PrEP, you should first be tested for HIV. You should then be tested every 3 months for as long as you are taking PrEP. Pregnancy  If you are premenopausal and you may become pregnant, ask your health care provider about preconception counseling.  If you may become pregnant, take 400 to 800 micrograms (mcg) of folic acid every day.  If you want to prevent pregnancy, talk to your health care provider about birth control (contraception). Osteoporosis and menopause  Osteoporosis is a disease in which the bones lose minerals and  strength with aging. This can result in serious  bone fractures. Your risk for osteoporosis can be identified using a bone density scan.  If you are 81 years of age or older, or if you are at risk for osteoporosis and fractures, ask your health care provider if you should be screened.  Ask your health care provider whether you should take a calcium or vitamin D supplement to lower your risk for osteoporosis.  Menopause may have certain physical symptoms and risks.  Hormone replacement therapy may reduce some of these symptoms and risks. Talk to your health care provider about whether hormone replacement therapy is right for you. Follow these instructions at home:  Schedule regular health, dental, and eye exams.  Stay current with your immunizations.  Do not use any tobacco products including cigarettes, chewing tobacco, or electronic cigarettes.  If you are pregnant, do not drink alcohol.  If you are breastfeeding, limit how much and how often you drink alcohol.  Limit alcohol intake to no more than 1 drink per day for nonpregnant women. One drink equals 12 ounces of beer, 5 ounces of wine, or 1 ounces of hard liquor.  Do not use street drugs.  Do not share needles.  Ask your health care provider for help if you need support or information about quitting drugs.  Tell your health care provider if you often feel depressed.  Tell your health care provider if you have ever been abused or do not feel safe at home. This information is not intended to replace advice given to you by your health care provider. Make sure you discuss any questions you have with your health care provider. Document Released: 05/29/2011 Document Revised: 04/20/2016 Document Reviewed: 08/17/2015 Elsevier Interactive Patient Education  [employer].

## 2018-05-17 NOTE — Progress Notes (Deleted)
Working out 4-5 times a we.   Mother opiate resistant  ozempic no thyroid cancer no pancreatitis.

## 2018-05-18 LAB — HEMOGLOBIN A1C
Hgb A1c MFr Bld: 13.9 % of total Hgb — ABNORMAL HIGH (ref <5.7)
Mean Plasma Glucose: 352 (calc)
eAG (mmol/L): 19.5 (calc)

## 2018-05-18 LAB — COMPLETE METABOLIC PANEL WITH GFR
AG RATIO: 1 (calc) (ref 1.0–2.5)
ALKALINE PHOSPHATASE (APISO): 89 U/L (ref 33–115)
ALT: 14 U/L (ref 6–29)
AST: 19 U/L (ref 10–30)
Albumin: 3.8 g/dL (ref 3.6–5.1)
BUN: 8 mg/dL (ref 7–25)
CALCIUM: 9.6 mg/dL (ref 8.6–10.2)
CHLORIDE: 102 mmol/L (ref 98–110)
CO2: 30 mmol/L (ref 20–32)
Creat: 0.73 mg/dL (ref 0.50–1.10)
GFR, EST NON AFRICAN AMERICAN: 101 mL/min/{1.73_m2} (ref >=60)
GFR, Est African American: 117 mL/min/{1.73_m2} (ref >=60)
Globulin: 3.8 g/dL (calc) — ABNORMAL HIGH (ref 1.9–3.7)
Glucose, Bld: 322 mg/dL — ABNORMAL HIGH (ref 65–99)
Potassium: 4.3 mmol/L (ref 3.5–5.3)
Sodium: 138 mmol/L (ref 135–146)
Total Bilirubin: 0.5 mg/dL (ref 0.2–1.2)
Total Protein: 7.6 g/dL (ref 6.1–8.1)

## 2018-05-18 LAB — LIPID PANEL W/REFLEX DIRECT LDL
CHOLESTEROL: 207 mg/dL — AB (ref <200)
HDL: 38 mg/dL — AB (ref >50)
LDL Cholesterol (Calc): 144 mg/dL (calc) — ABNORMAL HIGH
NON-HDL CHOLESTEROL (CALC): 169 mg/dL — AB (ref <130)
TRIGLYCERIDES: 123 mg/dL (ref <150)
Total CHOL/HDL Ratio: 5.4 (calc) — ABNORMAL HIGH (ref <5.0)

## 2018-05-18 LAB — CBC
HEMATOCRIT: 42 % (ref 35.0–45.0)
HEMOGLOBIN: 14 g/dL (ref 11.7–15.5)
MCH: 27.2 pg (ref 27.0–33.0)
MCHC: 33.3 g/dL (ref 32.0–36.0)
MCV: 81.7 fL (ref 80.0–100.0)
MPV: 10.8 fL (ref 7.5–12.5)
Platelets: 342 10*3/uL (ref 140–400)
RBC: 5.14 10*6/uL — AB (ref 3.80–5.10)
RDW: 13.8 % (ref 11.0–15.0)
WBC: 7 10*3/uL (ref 3.8–10.8)

## 2018-05-18 LAB — C. TRACHOMATIS/N. GONORRHOEAE RNA
C. trachomatis RNA, TMA: NOT DETECTED
N. GONORRHOEAE RNA, TMA: NOT DETECTED

## 2018-05-18 LAB — TSH: TSH: 2.69 mIU/L

## 2018-05-18 LAB — VITAMIN D 25 HYDROXY (VIT D DEFICIENCY, FRACTURES): Vit D, 25-Hydroxy: 12 ng/mL — ABNORMAL LOW (ref 30–100)

## 2018-05-19 ENCOUNTER — Encounter: Payer: Self-pay | Admitting: Physician Assistant

## 2018-05-20 ENCOUNTER — Encounter: Payer: Self-pay | Admitting: Physician Assistant

## 2018-05-20 DIAGNOSIS — E785 Hyperlipidemia, unspecified: Secondary | ICD-10-CM | POA: Insufficient documentation

## 2018-05-20 DIAGNOSIS — E559 Vitamin D deficiency, unspecified: Secondary | ICD-10-CM | POA: Insufficient documentation

## 2018-05-20 NOTE — Progress Notes (Signed)
Call pt: your A!C is very elevated at 13.9. We need to start the ozempic or similar medication for sugar. Are you ok with this?   You need to be on a cholesterol medication as well due to your LDL of 144. Are you ok with this?   Vitamin D very low. Need high dose weekly for 3 months then recheck. Are you ok this?   This is a lot of information. If you would like to come in and discuss ok to do this.

## 2018-05-21 MED ORDER — SEMAGLUTIDE(0.25 OR 0.5MG/DOS) 2 MG/1.5ML ~~LOC~~ SOPN: PEN_INJECTOR | SUBCUTANEOUS | 2 refills | Status: AC

## 2018-05-21 MED ORDER — ATORVASTATIN CALCIUM 40 MG PO TABS: 40.0000 mg | ORAL_TABLET | Freq: Every day | ORAL | 3 refills | Status: DC

## 2018-05-21 MED ORDER — ERGOCALCIFEROL 1.25 MG (50000 UT) PO CAPS: 50000.0000 [IU] | ORAL_CAPSULE | ORAL | 1 refills | Status: DC

## 2018-05-21 NOTE — Progress Notes (Signed)
Sent medications.

## 2018-05-21 NOTE — Addendum Note (Signed)
Addended by: Donella Stade on: 05/21/2018 01:15 PM   Modules accepted: Orders

## 2018-05-22 ENCOUNTER — Ambulatory Visit: Payer: BC Managed Care – PPO

## 2018-05-23 ENCOUNTER — Other Ambulatory Visit: Payer: Self-pay | Admitting: Physician Assistant

## 2018-05-23 ENCOUNTER — Ambulatory Visit (INDEPENDENT_AMBULATORY_CARE_PROVIDER_SITE_OTHER): Payer: BC Managed Care – PPO

## 2018-05-23 DIAGNOSIS — Z1231 Encounter for screening mammogram for malignant neoplasm of breast: Secondary | ICD-10-CM | POA: Diagnosis not present

## 2018-05-23 DIAGNOSIS — B372 Candidiasis of skin and nail: Secondary | ICD-10-CM

## 2018-05-23 NOTE — Progress Notes (Signed)
Call pt: normal mammogram. Follow up in 1 year.

## 2018-05-24 ENCOUNTER — Encounter: Payer: Self-pay | Admitting: Physician Assistant

## 2018-05-24 ENCOUNTER — Ambulatory Visit: Payer: BC Managed Care – PPO | Admitting: Physician Assistant

## 2018-05-24 VITALS — BP 148/100 | HR 88 | Wt 342.0 lb

## 2018-05-24 DIAGNOSIS — E1165 Type 2 diabetes mellitus with hyperglycemia: Secondary | ICD-10-CM

## 2018-05-24 DIAGNOSIS — E785 Hyperlipidemia, unspecified: Secondary | ICD-10-CM | POA: Diagnosis not present

## 2018-05-24 DIAGNOSIS — I10 Essential (primary) hypertension: Secondary | ICD-10-CM

## 2018-05-24 DIAGNOSIS — B372 Candidiasis of skin and nail: Secondary | ICD-10-CM

## 2018-05-24 DIAGNOSIS — L710 Perioral dermatitis: Secondary | ICD-10-CM | POA: Diagnosis not present

## 2018-05-24 DIAGNOSIS — IMO0001 Reserved for inherently not codable concepts without codable children: Secondary | ICD-10-CM

## 2018-05-24 MED ORDER — NYSTATIN-TRIAMCINOLONE 100000-0.1 UNIT/GM-% EX OINT: 1.0000 "application " | TOPICAL_OINTMENT | Freq: Two times a day (BID) | CUTANEOUS | 2 refills | Status: DC

## 2018-05-24 MED ORDER — HYDROCHLOROTHIAZIDE 12.5 MG PO TABS: 12.5000 mg | ORAL_TABLET | Freq: Every day | ORAL | 1 refills | Status: DC

## 2018-05-24 NOTE — Progress Notes (Signed)
Subjective:    Patient ID: Cynthia Lee, female    DOB: [birth year], 42 y.o.   MRN: [id number]  HPI Pt is a 42 yo obese female with T2DM, HTN, HLD who presents to the clinic to follow up on most recent labs.   She has started her benicar and is taking it nightly. She has not been checking her BP at home.   She is on xigduo for diabetes and will be starting Ozempic soon. She has not been checking blood sugars at home. She states she is being mindful of diet and making healthy substitutions for carbs.  She also has a rash around her mouth that she noticed yesterday after working out. She states it was worse in the heat and has improved since. There are also some bumps on the inside of her lower lip. She denies any new face or lip products. She denies new foods though she did have fish 6 days ago which is not frequently a part of her diet. She denies itching.   Review of Systems  All other systems reviewed and are negative.  .. Active Ambulatory Problems    Diagnosis Date Noted  . History of anemia 10/09/2012  . Obesity 10/09/2012  . Varicose vein of leg 10/09/2012  . Abnormal Pap smear, atypical squamous cells of undetermined sign (ASC-US) 10/31/2012  . Elevated fasting glucose 11/04/2012  . Benign essential hypertension 11/04/2012  . Metatarsalgia with corn of right foot 09/16/2014  . Morbid obesity (Cynthia Lee) 03/31/2015  . Uncontrolled type 2 diabetes mellitus without complication, without long-term current use of insulin (Cynthia Lee) 02/08/2016  . Yeast infection of the skin 12/05/2016  . Irritable bowel syndrome with diarrhea 12/05/2016  . Vitamin D deficiency 05/20/2018  . Dyslipidemia, goal LDL below 70 05/20/2018  . Perioral dermatitis 05/24/2018   Resolved Ambulatory Problems    Diagnosis Date Noted  . No Resolved Ambulatory Problems   Past Medical History:  Diagnosis Date  . Abnormal Pap smear, atypical squamous cells of undetermined sign (ASC-US)   . Anemia   . Diabetes  mellitus without complication (Cynthia Lee)    .[name] Kitchen Family History  Problem Relation Age of Onset  . Hyperlipidemia Mother        opiate resistant  . Hypertension Mother   . Alcohol abuse Father   . Diabetes Father   . Hypertension Father   . Hyperlipidemia Father   . Heart attack Father   . Stroke Paternal Grandmother   . Cancer Paternal Grandmother        ovarian cancer  . Diabetes Son        Objective:   Physical Exam  Constitutional: She is oriented to person, place, and time. She appears well-developed and well-nourished. No distress.  Obese.   HENT:  Head: Normocephalic and atraumatic.  Tiny flesh colored papules around mouth and some under lower lip.   Neck: No thyromegaly present.  Cardiovascular: Normal rate and regular rhythm.  Pulmonary/Chest: Effort normal and breath sounds normal.  Neurological: She is alert and oriented to person, place, and time.  Skin: Skin is warm and dry. Rash noted. She is not diaphoretic. No pallor.  White vesicular rash peri-oral and on oral mucosa of lower lip. No drainage or erythema.  Psychiatric: She has a normal mood and affect. Her behavior is normal.  Vitals reviewed.    .. Vitals:   05/24/18 1004 05/24/18 1025  BP: (!) 166/104 (!) 148/100  Pulse: 88   SpO2: 97%  Assessment & Plan:  [name] KitchenMarland KitchenKelin was seen today for follow-up.  Diagnoses and all orders for this visit:  Uncontrolled type 2 diabetes mellitus without complication, without long-term current use of insulin (HCC) -     Amb ref to Medical Nutrition Therapy-MNT  Yeast infection of the skin -     nystatin-triamcinolone ointment (MYCOLOG); Apply 1 application topically 2 (two) times daily. External use only.  Dyslipidemia, goal LDL below 70 -     Amb ref to Medical Nutrition Therapy-MNT  Morbid obesity (Cynthia Lee) -     Amb ref to Medical Nutrition Therapy-MNT  Perioral dermatitis  Benign essential hypertension -     hydrochlorothiazide (HYDRODIURIL) 12.5 MG  tablet; Take 1 tablet (12.5 mg total) by mouth daily.   T2DM - HgbA1c from last visit seven days ago 13.9. Discussed the importance of continuing to watch her diet and sugar intake. Referral to nutritionist for further dietary counseling. She will start the ozempic soon.   HTN - She restarted benicar 7 days ago with blood pressure still elevated today. Added Hctz and encouraged her to monitor BP at home. Discussed symptoms and signs of hypotension. Follow up in 1 month.   Dyslipidemia -  Discussed goal of LDL<70 and importance of diet. She was recently started on a statin. Referral to nutrition therapy.  Peri oral dermatitis- unclear etiology. Discussed new lipstick/gloss or something she is eating. Use benadryl as needed. Call if any tongue swelling, problems breathing.

## 2018-06-17 ENCOUNTER — Other Ambulatory Visit: Payer: Self-pay | Admitting: Physician Assistant

## 2018-06-17 DIAGNOSIS — I10 Essential (primary) hypertension: Secondary | ICD-10-CM

## 2018-06-25 ENCOUNTER — Encounter: Payer: Self-pay | Admitting: Physician Assistant

## 2018-06-25 ENCOUNTER — Ambulatory Visit (INDEPENDENT_AMBULATORY_CARE_PROVIDER_SITE_OTHER): Payer: BC Managed Care – PPO | Admitting: Physician Assistant

## 2018-06-25 VITALS — BP 150/82 | HR 67 | Ht 67.0 in | Wt 347.0 lb

## 2018-06-25 DIAGNOSIS — IMO0001 Reserved for inherently not codable concepts without codable children: Secondary | ICD-10-CM

## 2018-06-25 DIAGNOSIS — R200 Anesthesia of skin: Secondary | ICD-10-CM | POA: Insufficient documentation

## 2018-06-25 DIAGNOSIS — E1165 Type 2 diabetes mellitus with hyperglycemia: Secondary | ICD-10-CM | POA: Diagnosis not present

## 2018-06-25 DIAGNOSIS — I10 Essential (primary) hypertension: Secondary | ICD-10-CM

## 2018-06-25 MED ORDER — SEMAGLUTIDE(0.25 OR 0.5MG/DOS) 2 MG/1.5ML ~~LOC~~ SOPN: 0.5000 mg | PEN_INJECTOR | SUBCUTANEOUS | Status: DC

## 2018-06-25 NOTE — Patient Instructions (Signed)
Iliotibial Band Syndrome Rehab  Ask your health care provider which exercises are safe for you. Do exercises exactly as told by your health care provider and adjust them as directed. It is normal to feel mild stretching, pulling, tightness, or discomfort as you do these exercises, but you should stop right away if you feel sudden pain or your pain gets worse. Do not begin these exercises until told by your health care provider.  Stretching and range of motion exercises  These exercises warm up your muscles and joints and improve the movement and flexibility of your hip and pelvis.  Exercise A: Quadriceps, prone    1. Lie on your abdomen on a firm surface, such as a bed or padded floor.  2. Bend your left / right knee and hold your ankle. If you cannot reach your ankle or pant leg, loop a belt around your foot and grab the belt instead.  3. Gently pull your heel toward your buttocks. Your knee should not slide out to the side. You should feel a stretch in the front of your thigh and knee.  4. Hold this position for __________ seconds.  Repeat __________ times. Complete this stretch __________ times a day.  Exercise B: Iliotibial band    1. Lie on your side with your left / right leg in the top position.  2. Bend both of your knees and grab your left / right ankle. Stretch out your bottom arm to help you balance.  3. Slowly bring your top knee back so your thigh goes behind your trunk.  4. Slowly lower your top leg toward the floor until you feel a gentle stretch on the outside of your left / right hip and thigh. If you do not feel a stretch and your knee will not fall farther, place the heel of your other foot on top of your knee and pull your knee down toward the floor with your foot.  5. Hold this position for __________ seconds.  Repeat __________ times. Complete this stretch __________ times a day.  Strengthening exercises  These exercises build strength and endurance in your hip and pelvis. Endurance is the  ability to use your muscles for a long time, even after they get tired.  Exercise C: Straight leg raises (  hip abductors)  1. Lie on your side with your left / right leg in the top position. Lie so your head, shoulder, knee, and hip line up. You may bend your bottom knee to help you balance.  2. Roll your hips slightly forward so your hips are stacked directly over each other and your left / right knee is facing forward.  3. Tense the muscles in your outer thigh and lift your top leg 4-6 inches (10-15 cm).  4. Hold this position for __________ seconds.  5. Slowly return to the starting position. Let your muscles relax completely before doing another repetition.  Repeat __________ times. Complete this exercise __________ times a day.  Exercise D: Straight leg raises (  hip extensors)  1. Lie on your abdomen on your bed or a firm surface. You can put a pillow under your hips if that is more comfortable.  2. Bend your left / right knee so your foot is straight up in the air.  3. Squeeze your buttock muscles and lift your left / right thigh off the bed. Do not let your back arch.  4. Tense this muscle as hard as you can without increasing any knee pain.    5. Hold this position for __________ seconds.  6. Slowly lower your leg to the starting position and allow it to relax completely.  Repeat __________ times. Complete this exercise __________ times a day.  Exercise E: Hip hike  1. Stand sideways on a bottom step. Stand on your left / right leg with your other foot unsupported next to the step. You can hold onto the railing or wall if needed for balance.  2. Keep your knees straight and your torso square. Then, lift your left / right hip up toward the ceiling.  3. Slowly let your left / right hip lower toward the floor, past the starting position. Your foot should get closer to the floor. Do not lean or bend your knees.  Repeat __________ times. Complete this exercise __________ times a day.  This information is not  intended to replace advice given to you by your health care provider. Make sure you discuss any questions you have with your health care provider.  Document Released: 11/13/2005 Document Revised: 07/18/2016 Document Reviewed: 10/15/2015  Elsevier Interactive Patient Education © 2018 Elsevier Inc.

## 2018-06-25 NOTE — Progress Notes (Signed)
Subjective:    Patient ID: Cynthia Lee, female    DOB: [birth year], 42 y.o.   MRN: [id number]  HPI Pt is a 42 yr old obese female with T2DM, HTN, HLD presenting to the clinic with left thigh numbness and tingling with no radiation to lower leg. This started 2 weeks ago. She is able to ambulate with no problem. The numbness/tingling lasts a few minutes and goes away. She says it is alleviated with movement. No aggravating factors. It has happened 3 times since then with the last episode being last week. She doesn't have any pain associated with it. Pt has started exercising regularly when this started. Pt denies weakness or trauma to the area. She is currently asymptomatic.   Constipation: She says she normally has bowel movements after every meal. She says she feels she has been constipated on and off for the past week. She says she has not had a bowel movement today. She has started ozempic a month ago but not other medicine or diet changes. Denies abdominal pain, hematochezia, N/V, fever or chills. She says she drinks water regularly and eats a lot of vegetables with her diet.   DM: Pt is on dapagliflozin-metformin. Pt also recently started Ozempic a month ago. She says since being on ozempic she has not been eating a lot due to lack of appetite. She denies hypoglycemic events or any side effects with the medicine.    HTN: Pt says she takes her BP at home. They normally range in the 140-150s/90s. Pt denies cp, palpitation, vision changes. She takes Benicar and HCTZ. She is compliant with medication with no side effect.  .. Active Ambulatory Problems    Diagnosis Date Noted  . History of anemia 10/09/2012  . Obesity 10/09/2012  . Varicose vein of leg 10/09/2012  . Abnormal Pap smear, atypical squamous cells of undetermined sign (ASC-US) 10/31/2012  . Elevated fasting glucose 11/04/2012  . Benign essential hypertension 11/04/2012  . Metatarsalgia with corn of right foot 09/16/2014  . Morbid  obesity ([place]) 03/31/2015  . Uncontrolled type 2 diabetes mellitus without complication, without long-term current use of insulin ([place]) 02/08/2016  . Yeast infection of the skin 12/05/2016  . Irritable bowel syndrome with diarrhea 12/05/2016  . Vitamin D deficiency 05/20/2018  . Dyslipidemia, goal LDL below 70 05/20/2018  . Perioral dermatitis 05/24/2018  . Numbness of left lower extremity 06/25/2018   Resolved Ambulatory Problems    Diagnosis Date Noted  . No Resolved Ambulatory Problems   Past Medical History:  Diagnosis Date  . Abnormal Pap smear, atypical squamous cells of undetermined sign (ASC-US)   . Anemia   . Diabetes mellitus without complication ([place])      Review of Systems  Constitutional: Negative for chills, fatigue and fever.  Respiratory: Negative.  Negative for cough and shortness of breath.   Cardiovascular: Negative.  Negative for chest pain, palpitations and leg swelling.  Gastrointestinal: Positive for constipation. Negative for abdominal distention, abdominal pain, blood in stool, diarrhea, nausea and vomiting.  Musculoskeletal: Negative for back pain, gait problem, joint swelling and myalgias.  Neurological: Positive for numbness (Left lateral thigh numbness). Negative for weakness.  All other systems reviewed and are negative.      Objective:   Physical Exam  Constitutional: She is oriented to person, place, and time. She appears well-developed and well-nourished. No distress.  HENT:  Head: Normocephalic and atraumatic.  Eyes: Pupils are equal, round, and reactive to light. Conjunctivae and EOM are normal.  Neck:  Normal range of motion.  Cardiovascular: Normal rate, regular rhythm and normal heart sounds.  Pulmonary/Chest: Effort normal and breath sounds normal.  Musculoskeletal: Normal range of motion. She exhibits no edema, tenderness or deformity.  Neurological: She is alert and oriented to person, place, and time. She displays normal reflexes. No  sensory deficit. She exhibits normal muscle tone. Coordination normal.  Skin: Skin is warm and dry.      Assessment & Plan:   Vitals:   06/25/18 1535 06/25/18 1611  BP: (!) 166/81 (!) 150/82  Pulse: 76 67   Wt Readings from Last 3 Encounters:  06/25/18 (!) 347 lb (157.4 kg)  05/24/18 (!) 342 lb (155.1 kg)  05/17/18 (!) 342 lb (155.1 kg)  .Diagnoses and all orders for this visit:  Numbness of left lower extremity  Benign essential hypertension  Morbid obesity ([place])  Uncontrolled type 2 diabetes mellitus without complication, without long-term current use of insulin ([place])  Other orders -     Semaglutide (OZEMPIC) 0.25 or 0.5 MG/DOSE SOPN; Inject 0.5 mg into the skin once a week.   -Thigh numbness: Pt has been exercising more. Possible nerve compression or IT band syndrome due to lateral thigh location. Started once pt starting excercising more. Sensation and distal pulses are intact. Normal ROM. Denies pain or trauma to the area. No back pain, hip pain, weakness. Pt told to apply heat to the area and as needed NSAIDs. HO on IT band exercises provided. Encouraged to always stretch prior to exercise. Pt encouraged to follow up if she experiences pain, weakness, back or hip pain. Pt voiced understanding.    -Constipation: Likely from ozempic due its delayed gastric emptying effects. Encouraged to continue drinking 1.5 oz of water daily. Will follow up as needed.  -DM: Takes dapagliflozin-metformin and ozempic. Pt reports eating less since being on ozempic. Denies any side effects or hypoglycemic episodes. Follow up in 2 months for recheck.  -HTN: Pt instructed to increase Benicar to 40 mg daily from 20 mg. Pt is also taking HCTZ 12.5 mg daily. Denies vision changes, cp, palpitation. No side effects with medication. Will recheck at next DM visit.   [name] KitchenVernetta Honey PA-C, have reviewed and agree with the above documentation in it's entirety.

## 2018-07-09 ENCOUNTER — Other Ambulatory Visit: Payer: Self-pay | Admitting: Physician Assistant

## 2018-07-09 DIAGNOSIS — I10 Essential (primary) hypertension: Secondary | ICD-10-CM

## 2018-08-08 ENCOUNTER — Other Ambulatory Visit: Payer: Self-pay | Admitting: Physician Assistant

## 2018-08-08 DIAGNOSIS — I10 Essential (primary) hypertension: Secondary | ICD-10-CM

## 2018-08-19 ENCOUNTER — Ambulatory Visit: Payer: BC Managed Care – PPO | Admitting: Physician Assistant

## 2018-09-04 ENCOUNTER — Other Ambulatory Visit: Payer: Self-pay | Admitting: Physician Assistant

## 2018-09-04 DIAGNOSIS — I10 Essential (primary) hypertension: Secondary | ICD-10-CM

## 2018-09-21 ENCOUNTER — Other Ambulatory Visit: Payer: Self-pay | Admitting: Family Medicine

## 2018-09-21 DIAGNOSIS — I10 Essential (primary) hypertension: Secondary | ICD-10-CM

## 2018-11-01 ENCOUNTER — Other Ambulatory Visit: Payer: Self-pay | Admitting: Physician Assistant

## 2018-11-28 ENCOUNTER — Other Ambulatory Visit: Payer: Self-pay | Admitting: Physician Assistant

## 2018-11-28 DIAGNOSIS — B372 Candidiasis of skin and nail: Secondary | ICD-10-CM

## 2018-11-28 DIAGNOSIS — IMO0001 Reserved for inherently not codable concepts without codable children: Secondary | ICD-10-CM

## 2018-11-28 DIAGNOSIS — E1165 Type 2 diabetes mellitus with hyperglycemia: Secondary | ICD-10-CM

## 2018-12-12 ENCOUNTER — Other Ambulatory Visit: Payer: Self-pay | Admitting: Physician Assistant

## 2018-12-12 DIAGNOSIS — I10 Essential (primary) hypertension: Secondary | ICD-10-CM

## 2018-12-13 ENCOUNTER — Other Ambulatory Visit: Payer: Self-pay | Admitting: *Deleted

## 2018-12-13 ENCOUNTER — Other Ambulatory Visit: Payer: Self-pay | Admitting: Physician Assistant

## 2018-12-13 DIAGNOSIS — I10 Essential (primary) hypertension: Secondary | ICD-10-CM

## 2018-12-13 NOTE — Telephone Encounter (Signed)
Error

## 2018-12-31 ENCOUNTER — Ambulatory Visit (INDEPENDENT_AMBULATORY_CARE_PROVIDER_SITE_OTHER): Payer: 59 | Admitting: Physician Assistant

## 2018-12-31 ENCOUNTER — Encounter: Payer: Self-pay | Admitting: Physician Assistant

## 2018-12-31 DIAGNOSIS — IMO0001 Reserved for inherently not codable concepts without codable children: Secondary | ICD-10-CM

## 2018-12-31 DIAGNOSIS — I10 Essential (primary) hypertension: Secondary | ICD-10-CM

## 2018-12-31 DIAGNOSIS — E1165 Type 2 diabetes mellitus with hyperglycemia: Secondary | ICD-10-CM

## 2018-12-31 LAB — POCT GLYCOSYLATED HEMOGLOBIN (HGB A1C): Hemoglobin A1C: 13 % — AB (ref 4.0–5.6)

## 2018-12-31 MED ORDER — OLMESARTAN MEDOXOMIL 20 MG PO TABS: 20.0000 mg | ORAL_TABLET | Freq: Every day | ORAL | 1 refills | Status: DC

## 2018-12-31 MED ORDER — DAPAGLIFLOZIN PRO-METFORMIN ER 10-1000 MG PO TB24: 1.0000 | ORAL_TABLET | Freq: Every day | ORAL | 2 refills | Status: DC

## 2018-12-31 MED ORDER — SEMAGLUTIDE(0.25 OR 0.5MG/DOS) 2 MG/1.5ML ~~LOC~~ SOPN: 0.5000 mg | PEN_INJECTOR | SUBCUTANEOUS | 2 refills | Status: DC

## 2018-12-31 MED ORDER — HYDROCHLOROTHIAZIDE 12.5 MG PO TABS: 12.5000 mg | ORAL_TABLET | Freq: Every day | ORAL | 1 refills | Status: DC

## 2018-12-31 MED ORDER — INSULIN DEGLUDEC 100 UNIT/ML ~~LOC~~ SOPN: PEN_INJECTOR | SUBCUTANEOUS | 2 refills | Status: DC

## 2018-12-31 MED ORDER — AMBULATORY NON FORMULARY MEDICATION: 1 refills | Status: AC

## 2018-12-31 NOTE — Progress Notes (Signed)
Subjective:    Patient ID: Cynthia Lee, female    DOB: 1976-07-26, 43 y.o.   MRN: [id number]  HPI  Pt is a 43 yo obese female with T2DM, HTN who presents to the clinic for follow up and to get refills.   She admits she has been out of all medications for the last 2 months. No CP, palpitations. She is urinating all the time. She is very tired. She is not checking her sugars. She is having some blurred vision. She has intermittent numbness and tingling of extremities.   .. Active Ambulatory Problems    Diagnosis Date Noted  . History of anemia 10/09/2012  . Obesity 10/09/2012  . Varicose vein of leg 10/09/2012  . Abnormal Pap smear, atypical squamous cells of undetermined sign (ASC-US) 10/31/2012  . Elevated fasting glucose 11/04/2012  . Benign essential hypertension 11/04/2012  . Metatarsalgia with corn of right foot 09/16/2014  . Morbid obesity ([place]) 03/31/2015  . Uncontrolled type 2 diabetes mellitus without complication, without long-term current use of insulin ([place]) 02/08/2016  . Yeast infection of the skin 12/05/2016  . Irritable bowel syndrome with diarrhea 12/05/2016  . Vitamin D deficiency 05/20/2018  . Dyslipidemia, goal LDL below 70 05/20/2018  . Perioral dermatitis 05/24/2018  . Numbness of left lower extremity 06/25/2018   Resolved Ambulatory Problems    Diagnosis Date Noted  . No Resolved Ambulatory Problems   Past Medical History:  Diagnosis Date  . Anemia   . Diabetes mellitus without complication ([place])      Review of Systems  All other systems reviewed and are negative.      Objective:   Physical Exam Vitals signs reviewed.  Constitutional:      Appearance: Normal appearance. She is obese.  HENT:     Head: Normocephalic and atraumatic.  Cardiovascular:     Rate and Rhythm: Normal rate and regular rhythm.  Pulmonary:     Effort: Pulmonary effort is normal.     Breath sounds: Normal breath sounds.  Neurological:     General: No focal deficit  present.     Mental Status: She is alert and oriented to person, place, and time.  Psychiatric:        Mood and Affect: Mood normal.        Behavior: Behavior normal.           Assessment & Plan:  [name] KitchenMarland KitchenDiagnoses and all orders for this visit:  Morbid obesity ([place]) -     Amb ref to Medical Nutrition Therapy-MNT  Uncontrolled type 2 diabetes mellitus without complication, without long-term current use of insulin ([place]) -     AMBULATORY NON FORMULARY MEDICATION; Glucometer lancets and test strips to check sugars once or twice a day for Diabetic control. -     Dapagliflozin-metFORMIN HCl ER (XIGDUO XR) 08-999 MG TB24; Take 1 tablet by mouth daily. -     insulin degludec (TRESIBA FLEXTOUCH) 100 UNIT/ML SOPN FlexTouch Pen; 10 units every night increase by 3 units every 5 dayts until morning glucose 90-130. -     Semaglutide,0.25 or 0.5MG /DOS, (OZEMPIC, 0.25 OR 0.5 MG/DOSE,) 2 MG/1.5ML SOPN; Inject 0.5 mg into the skin once a week. -     POCT glycosylated hemoglobin (Hb A1C) -     Amb ref to Medical Nutrition Therapy-MNT  Benign essential hypertension -     hydrochlorothiazide (HYDRODIURIL) 12.5 MG tablet; Take 1 tablet (12.5 mg total) by mouth daily. -     olmesartan (BENICAR) 20 MG  tablet; Take 1 tablet (20 mg total) by mouth daily.   .. Results for orders placed or performed in visit on 12/31/18  POCT glycosylated hemoglobin (Hb A1C)  Result Value Ref Range   Hemoglobin A1C 13.0 (A) 4.0 - 5.6 %   HbA1c POC (<> result, manual entry)     HbA1c, POC (prediabetic range)     HbA1c, POC (controlled diabetic range)     Down just a hair from last a1c but far from goal. ofcourse she has not had medication in 2 months.  Long discussion about medication, importance and how to use them.  On xigduo. Added ozempic and tresiba. Coupon cards given.  bp not to goal. Refilled benicar.  On statin.  Needs eye exam. Will try to get records from next door.  Discussed diabetic diet. Pt went to  nutritionist did not find it helpful.   Pt aware goal morning blood sugars are 90 to 130. Discussed signs of hypoglycemia.   Follow up in 3 months.   [name] Kitchen.Spent 30 minutes with patient and greater than 50 percent of visit spent counseling patient regarding treatment plan and how to use medications and titrate up.

## 2018-12-31 NOTE — Patient Instructions (Addendum)
Fasting sugars under 90-130.     Think about gastric sleeve surgery.

## 2019-01-02 ENCOUNTER — Telehealth: Payer: Self-pay | Admitting: Physician Assistant

## 2019-01-02 ENCOUNTER — Encounter: Payer: Self-pay | Admitting: Physician Assistant

## 2019-01-02 NOTE — Telephone Encounter (Signed)
Pt stated she had eye exam at the eye center next door.

## 2019-01-03 ENCOUNTER — Telehealth: Payer: Self-pay

## 2019-01-03 NOTE — Telephone Encounter (Signed)
Fax request has been sent. Thanks!

## 2019-01-03 NOTE — Telephone Encounter (Signed)
I am waiting to see if we need a new card on file. I have sent a MyChart message to the patient to see if the insurance we have on file is current.

## 2019-01-03 NOTE — Telephone Encounter (Signed)
Patient called today saying that for her insurance to cover her xigduo prescription, she is going to need a prior authorization. Please advise. Thanks!

## 2019-01-03 NOTE — Telephone Encounter (Signed)
Can we look into this?

## 2019-01-06 NOTE — Telephone Encounter (Signed)
I called the patient back and she has new insurance and will get me the information one the new card so I can work on this for her.

## 2019-01-29 ENCOUNTER — Other Ambulatory Visit: Payer: Self-pay | Admitting: Physician Assistant

## 2019-01-29 MED ORDER — EMPAGLIFLOZIN-METFORMIN HCL ER 10-1000 MG PO TB24: 1.0000 | ORAL_TABLET | Freq: Every day | ORAL | 2 refills | Status: DC

## 2019-01-29 NOTE — Progress Notes (Signed)
Insurance sent letter to replace xigduo with synjardy. Done.

## 2019-03-06 ENCOUNTER — Encounter: Payer: Self-pay | Admitting: Physician Assistant

## 2019-03-28 DIAGNOSIS — Z719 Counseling, unspecified: Secondary | ICD-10-CM | POA: Diagnosis not present

## 2019-03-31 ENCOUNTER — Encounter: Payer: Self-pay | Admitting: Physician Assistant

## 2019-03-31 ENCOUNTER — Ambulatory Visit (INDEPENDENT_AMBULATORY_CARE_PROVIDER_SITE_OTHER): Payer: 59 | Admitting: Physician Assistant

## 2019-03-31 VITALS — BP 164/90 | Temp 98.2°F | Ht 67.0 in | Wt 340.0 lb

## 2019-03-31 DIAGNOSIS — E1165 Type 2 diabetes mellitus with hyperglycemia: Secondary | ICD-10-CM | POA: Diagnosis not present

## 2019-03-31 DIAGNOSIS — I10 Essential (primary) hypertension: Secondary | ICD-10-CM

## 2019-03-31 DIAGNOSIS — E559 Vitamin D deficiency, unspecified: Secondary | ICD-10-CM

## 2019-03-31 DIAGNOSIS — IMO0001 Reserved for inherently not codable concepts without codable children: Secondary | ICD-10-CM

## 2019-03-31 DIAGNOSIS — E782 Mixed hyperlipidemia: Secondary | ICD-10-CM

## 2019-03-31 DIAGNOSIS — E785 Hyperlipidemia, unspecified: Secondary | ICD-10-CM

## 2019-03-31 DIAGNOSIS — Z6841 Body Mass Index (BMI) 40.0 and over, adult: Secondary | ICD-10-CM

## 2019-03-31 MED ORDER — EMPAGLIFLOZIN-METFORMIN HCL ER 10-1000 MG PO TB24: 1.0000 | ORAL_TABLET | Freq: Every day | ORAL | 2 refills | Status: DC

## 2019-03-31 MED ORDER — VITAMIN D (ERGOCALCIFEROL) 1.25 MG (50000 UNIT) PO CAPS: 50000.0000 [IU] | ORAL_CAPSULE | ORAL | 3 refills | Status: DC

## 2019-03-31 MED ORDER — OLMESARTAN MEDOXOMIL-HCTZ 40-12.5 MG PO TABS: 1.0000 | ORAL_TABLET | Freq: Every day | ORAL | 1 refills | Status: DC

## 2019-03-31 MED ORDER — INSULIN DEGLUDEC 100 UNIT/ML ~~LOC~~ SOPN: PEN_INJECTOR | SUBCUTANEOUS | 2 refills | Status: DC

## 2019-03-31 MED ORDER — SEMAGLUTIDE(0.25 OR 0.5MG/DOS) 2 MG/1.5ML ~~LOC~~ SOPN: 0.5000 mg | PEN_INJECTOR | SUBCUTANEOUS | 2 refills | Status: DC

## 2019-03-31 NOTE — Progress Notes (Deleted)
Patient doing okay. Has not been checking blood sugars (gave her reader to her mother and hasn't gotten a new one). Eye exam was cancelled due to Covid-19, was scheduled in April. Will reschedule. Due for A1C.

## 2019-03-31 NOTE — Progress Notes (Signed)
Patient ID: Cynthia Lee, female   DOB: 1976/10/16, 43 y.o.   MRN: [id number] .[name] KitchenVirtual Visit via Video Note  I connected with Cynthia Lee on 04/02/19 at  8:10 AM EDT by a video enabled telemedicine application and verified that I am speaking with the correct person using two identifiers.  Location: Patient: home Provider: clinic   I discussed the limitations of evaluation and management by telemedicine and the availability of in person appointments. The patient expressed understanding and agreed to proceed.  History of Present Illness: Pt is a 43 yo morbidly obese female with T2DM, HTN, dyslipidemia who calls in for 3 month follow up and medication refills.   DM- she is not checking sugars because she gave her glucometer away. She is taking synjardy, ozempic and tresbia. She has stayed on tresbia 10units. Tolerating medications well. No hypoglycemic events. No open sores of wounds. She is trying to keep to DM diet but not sure how good she is doing.   She is taking benicar/HCTZ. Pt denies any CP, palpitations, headaches, or vision changes. She is getting readings of 156/90 at home.   .. Active Ambulatory Problems    Diagnosis Date Noted  . History of anemia 10/09/2012  . Obesity 10/09/2012  . Varicose vein of leg 10/09/2012  . Abnormal Pap smear, atypical squamous cells of undetermined sign (ASC-US) 10/31/2012  . Elevated fasting glucose 11/04/2012  . Benign essential hypertension 11/04/2012  . Metatarsalgia with corn of right foot 09/16/2014  . Morbid obesity ([place]) 03/31/2015  . Uncontrolled type 2 diabetes mellitus without complication, without long-term current use of insulin ([place]) 02/08/2016  . Yeast infection of the skin 12/05/2016  . Irritable bowel syndrome with diarrhea 12/05/2016  . Vitamin D deficiency 05/20/2018  . Dyslipidemia, goal LDL below 70 05/20/2018  . Perioral dermatitis 05/24/2018  . Numbness of left lower extremity 06/25/2018   Resolved Ambulatory Problems     Diagnosis Date Noted  . No Resolved Ambulatory Problems   Past Medical History:  Diagnosis Date  . Anemia   . Diabetes mellitus without complication ([place])    Reviewed med, allergy, problem list.     Observations/Objective: No acute distress.  Normal mood.  Normal appearance.   .. Today's Vitals   03/31/19 0807  BP: (!) 164/90  Temp: 98.2 F (36.8 C)  TempSrc: Oral  Weight: (!) 340 lb (154.2 kg)  Height: 5\' 7"  (1.702 m)   Body mass index is 53.25 kg/m.   Assessment and Plan: [name] KitchenMarland KitchenKelle was seen today for diabetes.  Diagnoses and all orders for this visit:  Uncontrolled type 2 diabetes mellitus without complication, without long-term current use of insulin ([facility]) -     insulin degludec (TRESIBA FLEXTOUCH) 100 UNIT/ML SOPN FlexTouch Pen; 10 units every night increase by 3 units every 5 dayts until morning glucose 90-130. -     Semaglutide,0.25 or 0.5MG /DOS, (OZEMPIC, 0.25 OR 0.5 MG/DOSE,) 2 MG/1.5ML SOPN; Inject 0.5 mg into the skin once a week. -     Empagliflozin-metFORMIN HCl ER (SYNJARDY XR) 08-999 MG TB24; Take 1 tablet by mouth daily. -     COMPLETE METABOLIC PANEL WITH GFR -     Hemoglobin A1c  Mixed hyperlipidemia -     Lipid Panel w/reflex Direct LDL -     COMPLETE METABOLIC PANEL WITH GFR  Essential hypertension -     COMPLETE METABOLIC PANEL WITH GFR  Benign essential hypertension -     olmesartan-hydrochlorothiazide (BENICAR HCT) 40-12.5 MG tablet; Take 1 tablet by  mouth daily.  Dyslipidemia, goal LDL below 70  Class 3 severe obesity due to excess calories with serious comorbidity and body mass index (BMI) of 50.0 to 59.9 in adult [facility])  Vitamin D deficiency -     Vitamin D, Ergocalciferol, (DRISDOL) 1.25 MG (50000 UT) CAPS capsule; Take 1 capsule (50,000 Units total) by mouth every 7 (seven) days.  needs lipid level and A1C ordered. A!C was very elevated at last visit and need to see how we have gotten down.   Continue tresbia 10 units at night  but needs to start checking sugars to determine if needs to increase by 3 units every 5 days to get to goal of fasting sugars btw 90-130.  Increased ozempic to 1mg  weekly.  Continue synjardy.  On statin. On ARB. BP not controlled. Increased benicar today.  Needs eye exam.  Follow up in 3 months.   [name] Kitchen.Discussed low carb diet with 1500 calories and 80g of protein.  Exercising at least 150 minutes a week.  [employer]itness Pal could be a Microbiologist.     Follow Up Instructions:    I discussed the assessment and treatment plan with the patient. The patient was provided an opportunity to ask questions and all were answered. The patient agreed with the plan and demonstrated an understanding of the instructions.   The patient was advised to call back or seek an in-person evaluation if the symptoms worsen or if the condition fails to improve as anticipated.  I provided 25 minutes of non-face-to-face time during this encounter. Spent additional 15 minutes reviewing chart and gather information to make a plan for patient.    Iran Planas, PA-C

## 2019-04-02 ENCOUNTER — Telehealth: Payer: Self-pay | Admitting: Neurology

## 2019-04-02 DIAGNOSIS — IMO0001 Reserved for inherently not codable concepts without codable children: Secondary | ICD-10-CM

## 2019-04-02 DIAGNOSIS — E1165 Type 2 diabetes mellitus with hyperglycemia: Principal | ICD-10-CM

## 2019-04-02 MED ORDER — INSULIN DEGLUDEC 100 UNIT/ML ~~LOC~~ SOPN: PEN_INJECTOR | SUBCUTANEOUS | 0 refills | Status: DC

## 2019-04-02 NOTE — Telephone Encounter (Signed)
40 units is max dose.

## 2019-04-02 NOTE — Telephone Encounter (Signed)
[facility] called back and they have to have a max dose for patient in order to fill Cynthia Lee (they realize we are titrating, but have to have the max dose). Please advise.

## 2019-04-02 NOTE — Telephone Encounter (Signed)
Pharmacy called and states they can not split a box for [place], it comes 5 pens per box. RX resent.

## 2019-04-03 NOTE — Telephone Encounter (Signed)
Pharmacist at [facility] advised.

## 2019-04-07 ENCOUNTER — Encounter: Payer: Self-pay | Admitting: Physician Assistant

## 2019-04-24 ENCOUNTER — Encounter: Payer: Self-pay | Admitting: Physician Assistant

## 2019-04-24 DIAGNOSIS — IMO0001 Reserved for inherently not codable concepts without codable children: Secondary | ICD-10-CM

## 2019-04-24 MED ORDER — "PEN NEEDLES 3/16"" 31G X 5 MM MISC": 5 refills | Status: DC

## 2019-08-07 ENCOUNTER — Other Ambulatory Visit: Payer: Self-pay | Admitting: Physician Assistant

## 2019-08-07 DIAGNOSIS — IMO0001 Reserved for inherently not codable concepts without codable children: Secondary | ICD-10-CM

## 2019-10-07 ENCOUNTER — Encounter: Payer: Self-pay | Admitting: Physician Assistant

## 2019-10-07 ENCOUNTER — Ambulatory Visit (INDEPENDENT_AMBULATORY_CARE_PROVIDER_SITE_OTHER): Payer: 59 | Admitting: Physician Assistant

## 2019-10-07 VITALS — HR 70 | Temp 98.7°F | Ht 67.0 in | Wt 328.0 lb

## 2019-10-07 DIAGNOSIS — Z1231 Encounter for screening mammogram for malignant neoplasm of breast: Secondary | ICD-10-CM | POA: Diagnosis not present

## 2019-10-07 DIAGNOSIS — Z79899 Other long term (current) drug therapy: Secondary | ICD-10-CM | POA: Diagnosis not present

## 2019-10-07 DIAGNOSIS — E1165 Type 2 diabetes mellitus with hyperglycemia: Secondary | ICD-10-CM

## 2019-10-07 DIAGNOSIS — I10 Essential (primary) hypertension: Secondary | ICD-10-CM | POA: Diagnosis not present

## 2019-10-07 DIAGNOSIS — E785 Hyperlipidemia, unspecified: Secondary | ICD-10-CM

## 2019-10-07 MED ORDER — SYNJARDY XR 10-1000 MG PO TB24: 1.0000 | ORAL_TABLET | Freq: Every day | ORAL | 0 refills | Status: DC

## 2019-10-07 MED ORDER — OZEMPIC (1 MG/DOSE) 2 MG/1.5ML ~~LOC~~ SOPN: 1.0000 mg | PEN_INJECTOR | SUBCUTANEOUS | 2 refills | Status: DC

## 2019-10-07 NOTE — Progress Notes (Signed)
Blood sugar this morning - 148 Usually Range 90-138 Needs refill Synjardy

## 2019-10-07 NOTE — Progress Notes (Signed)
Patient ID: Cynthia Lee, female   DOB: 1975/12/16, 43 y.o.   MRN: [id number] .[name] KitchenVirtual Visit via Video Note  I connected with Cynthia Lee on 10/13/19 at 11:10 AM EST by a video enabled telemedicine application and verified that I am speaking with the correct person using two identifiers.  Location: Patient: home Provider: clinic   I discussed the limitations of evaluation and management by telemedicine and the availability of in person appointments. The patient expressed understanding and agreed to proceed.  History of Present Illness: Pt is a 43 yo obese female with T2DM, HTN, IBS, dyslipidemia who calls into the clinic for refills.   Pt was seen in may 2020 also virtually. She never came to get her labs done. Last a1c is 13 from 9 months ago. She feels like she is doing better. She is checking her sugars and running around the 130's in am. She is up to 14units of tresbia. She has been out of synjardy. She denies any hypoglycemia. No open sores or wounds.  .. Active Ambulatory Problems    Diagnosis Date Noted  . History of anemia 10/09/2012  . Obesity 10/09/2012  . Varicose vein of leg 10/09/2012  . Abnormal Pap smear, atypical squamous cells of undetermined sign (ASC-US) 10/31/2012  . Elevated fasting glucose 11/04/2012  . Benign essential hypertension 11/04/2012  . Metatarsalgia with corn of right foot 09/16/2014  . Morbid obesity ([place]) 03/31/2015  . Uncontrolled type 2 diabetes mellitus without complication, without long-term current use of insulin 02/08/2016  . Yeast infection of the skin 12/05/2016  . Irritable bowel syndrome with diarrhea 12/05/2016  . Vitamin D deficiency 05/20/2018  . Dyslipidemia, goal LDL below 70 05/20/2018  . Perioral dermatitis 05/24/2018  . Numbness of left lower extremity 06/25/2018  . Uncontrolled type 2 diabetes mellitus with hyperglycemia ([place]) 10/07/2019   Resolved Ambulatory Problems    Diagnosis Date Noted  . No Resolved Ambulatory Problems    Past Medical History:  Diagnosis Date  . Anemia   . Diabetes mellitus without complication ([place])    Reviewed med, allergy, problem list.    Observations/Objective: No acute distress. Normal mood and appearance.   .. Today's Vitals   10/07/19 1058  Pulse: 70  Temp: 98.7 F (37.1 C)  TempSrc: Oral  Weight: (!) 328 lb (148.8 kg)  Height: 5\' 7"  (1.702 m)   Body mass index is 51.37 kg/m.    Assessment and Plan: [name] KitchenMarland KitchenKamren was seen today for diabetes.  Diagnoses and all orders for this visit:  Uncontrolled type 2 diabetes mellitus with hyperglycemia (HCC) -     COMPLETE METABOLIC PANEL WITH GFR -     Hemoglobin A1c -     Empagliflozin-metFORMIN HCl ER (SYNJARDY XR) 08-999 MG TB24; Take 1 tablet by mouth daily. -     Semaglutide, 1 MG/DOSE, (OZEMPIC, 1 MG/DOSE,) 2 MG/1.5ML SOPN; Inject 1 mg into the skin once a week.  Benign essential hypertension -     Lipid Panel w/reflex Direct LDL  Medication management -     Lipid Panel w/reflex Direct LDL -     COMPLETE METABOLIC PANEL WITH GFR -     Hemoglobin A1c  Visit for screening mammogram -     MM 3D SCREEN BREAST BILATERAL  Dyslipidemia, goal LDL below 70 -     Lipid Panel w/reflex Direct LDL -     atorvastatin (LIPITOR) 40 MG tablet; Take 1 tablet (40 mg total) by mouth daily.  Morbid obesity ([place])   Pt  needs labs.  Continue on tresiba/ozempic/synjardy. Increase ozempic to 1mg .  Discussed diabetic diet.  On STATIN. On ARB. Needs eye exam.  Flu shot UTD.   PLEASE go get labs.   Follow up in 3 months.   Follow Up Instructions:    I discussed the assessment and treatment plan with the patient. The patient was provided an opportunity to ask questions and all were answered. The patient agreed with the plan and demonstrated an understanding of the instructions.   The patient was advised to call back or seek an in-person evaluation if the symptoms worsen or if the condition fails to improve as  anticipated.   Iran Planas, PA-C

## 2019-10-13 MED ORDER — ATORVASTATIN CALCIUM 40 MG PO TABS: 40.0000 mg | ORAL_TABLET | Freq: Every day | ORAL | 3 refills | Status: DC

## 2019-10-23 LAB — LIPID PANEL W/REFLEX DIRECT LDL
Cholesterol: 136 mg/dL (ref <200)
HDL: 36 mg/dL — ABNORMAL LOW (ref >=50)
LDL Cholesterol (Calc): 81 mg/dL (calc)
Non-HDL Cholesterol (Calc): 100 mg/dL (calc) (ref <130)
Total CHOL/HDL Ratio: 3.8 (calc) (ref <5.0)
Triglycerides: 91 mg/dL (ref <150)

## 2019-10-23 LAB — COMPLETE METABOLIC PANEL WITH GFR
AG Ratio: 1.1 (calc) (ref 1.0–2.5)
ALT: 8 U/L (ref 6–29)
AST: 15 U/L (ref 10–35)
Albumin: 4 g/dL (ref 3.6–5.1)
Alkaline phosphatase (APISO): 69 U/L (ref 31–125)
BUN: 10 mg/dL (ref 7–25)
CO2: 26 mmol/L (ref 20–32)
Calcium: 9.6 mg/dL (ref 8.6–10.2)
Chloride: 103 mmol/L (ref 98–110)
Creat: 0.81 mg/dL (ref 0.50–1.10)
GFR, Est African American: 102 mL/min/{1.73_m2} (ref >=60)
GFR, Est Non African American: 88 mL/min/{1.73_m2} (ref >=60)
Globulin: 3.7 g/dL (calc) (ref 1.9–3.7)
Glucose, Bld: 124 mg/dL — ABNORMAL HIGH (ref 65–99)
Potassium: 4.1 mmol/L (ref 3.5–5.3)
Sodium: 140 mmol/L (ref 135–146)
Total Bilirubin: 0.5 mg/dL (ref 0.2–1.2)
Total Protein: 7.7 g/dL (ref 6.1–8.1)

## 2019-10-23 LAB — HEMOGLOBIN A1C
Hgb A1c MFr Bld: 7.9 % of total Hgb — ABNORMAL HIGH (ref <5.7)
Mean Plasma Glucose: 180 (calc)
eAG (mmol/L): 10 (calc)

## 2019-10-26 NOTE — Progress Notes (Signed)
Latronda,   LDL(bad cholesterol) much better!. TG look good. HDL(good cholesterol) could be better. Goal LDL is under 70. Almost to goal. Recheck in 6 months. If not there will increase lipitor.   WAY to go. A1C is down to 7.9 from 13. Keep up the good work.   Luvenia Starch

## 2019-11-26 ENCOUNTER — Ambulatory Visit (INDEPENDENT_AMBULATORY_CARE_PROVIDER_SITE_OTHER): Payer: 59

## 2019-11-26 ENCOUNTER — Other Ambulatory Visit: Payer: Self-pay

## 2019-11-26 DIAGNOSIS — Z1231 Encounter for screening mammogram for malignant neoplasm of breast: Secondary | ICD-10-CM | POA: Diagnosis not present

## 2019-11-27 ENCOUNTER — Other Ambulatory Visit: Payer: Self-pay

## 2019-11-27 DIAGNOSIS — B372 Candidiasis of skin and nail: Secondary | ICD-10-CM

## 2019-11-27 MED ORDER — NYSTATIN-TRIAMCINOLONE 100000-0.1 UNIT/GM-% EX CREA: TOPICAL_CREAM | Freq: Two times a day (BID) | CUTANEOUS | 1 refills | Status: DC

## 2019-11-27 NOTE — Progress Notes (Signed)
Normal mammogram. Follow up in 1 year.

## 2020-01-12 ENCOUNTER — Telehealth: Payer: Self-pay | Admitting: Physician Assistant

## 2020-01-12 NOTE — Telephone Encounter (Signed)
Received fax for PA on Xigduo sent through cover my meds waiting on determination. - CF 

## 2020-01-13 NOTE — Telephone Encounter (Signed)
Received fax from [employer] and they approved coverage on Xigduo through 01/11/2021.  Reference: [id number] - CF

## 2020-02-20 ENCOUNTER — Other Ambulatory Visit: Payer: Self-pay | Admitting: Physician Assistant

## 2020-02-20 DIAGNOSIS — I10 Essential (primary) hypertension: Secondary | ICD-10-CM

## 2020-02-20 NOTE — Telephone Encounter (Signed)
Must make appointment 

## 2020-04-20 ENCOUNTER — Encounter: Payer: Self-pay | Admitting: Nurse Practitioner

## 2020-04-20 ENCOUNTER — Telehealth: Payer: 59 | Admitting: Physician Assistant

## 2020-04-20 ENCOUNTER — Telehealth (INDEPENDENT_AMBULATORY_CARE_PROVIDER_SITE_OTHER): Payer: 59 | Admitting: Nurse Practitioner

## 2020-04-20 DIAGNOSIS — E1165 Type 2 diabetes mellitus with hyperglycemia: Secondary | ICD-10-CM

## 2020-04-20 MED ORDER — SEMAGLUTIDE(0.25 OR 0.5MG/DOS) 2 MG/1.5ML ~~LOC~~ SOPN: 0.5000 mg | PEN_INJECTOR | SUBCUTANEOUS | 4 refills | Status: DC

## 2020-04-20 NOTE — Patient Instructions (Signed)
Decrease semaglutide to 0.5mg  weekly injection.   Let us know if you continue to have symptoms after decreasing the dose.   Follow-up as usual with Jade for your diabetes management or sooner, if needed.

## 2020-04-20 NOTE — Progress Notes (Signed)
Virtual Visit via MyChart Note  I connected with  Cynthia Lee on 04/20/20 at  1:30 PM EDT by the video enabled telemedicine application, MyChart, and verified that I am speaking with the correct person using two identifiers.   I introduced myself as a Designer, jewellery with the practice. We discussed the limitations of evaluation and management by telemedicine and the availability of in person appointments. The patient expressed understanding and agreed to proceed.  The patient is: in her car I am: in the office  Subjective:    CC: Digestion problems with DM meds  HPI: Cynthia Lee is a 44 y.o. y/o female presenting via [facility] today for inreased fatigue, nausea, vomiting, and diarrhea for several days after utilizing the 1mg  injection of semaglutide. She reports the day after taking the dose she feels so bad she is unable to go to church. She reports she did not have any problems on the 0.5mg  dose, but once she increased the dose to 1mg  she began having significant problems. She reports her blood sugars have been well controlled and she has not had any hypoglycemic episodes. She is willing to go back down on the dose to the 0.5mg , but does not want to continue with the 1mg  dose.   Past medical history, Surgical history, Family history not pertinant except as noted below, Social history, Allergies, and medications have been entered into the medical record, reviewed, and corrections made.   Review of Systems:  See HPI  Objective:    General: Speaking clearly in complete sentences without any shortness of breath.  Alert and oriented x3.  Normal judgment. No apparent acute distress.   Impression and Recommendations:    1. Uncontrolled type 2 diabetes mellitus with hyperglycemia (HCC) Intolerance to increased dose of semaglutide. Discussed the option of decreasing back to the 0.5mg  weekly dose with the patient and she is in favor of this option.  Plan to decrease semaglutide to 0.5mg   weekly.  Follow-up if symptoms continue after decreasing dose and continue to monitor blood sugars for hyper and hypoglycemic episodes. Follow-up as usual with PCP for scheduled diabetic management or sooner if needed.  - Semaglutide,0.25 or 0.5MG /DOS, 2 MG/1.5ML SOPN; Inject 0.5 mg into the skin once a week.  Dispense: 2 pen; Refill: 4    I discussed the assessment and treatment plan with the patient. The patient was provided an opportunity to ask questions and all were answered. The patient agreed with the plan and demonstrated an understanding of the instructions.   The patient was advised to call back or seek an in-person evaluation if the symptoms worsen or if the condition fails to improve as anticipated.  I provided 8 minutes of non-face-to-face interaction with this [place] visit.   Orma Render, NP

## 2020-06-02 ENCOUNTER — Encounter: Payer: Self-pay | Admitting: Physician Assistant

## 2020-06-02 NOTE — Telephone Encounter (Signed)
Ok for referral?

## 2020-06-12 ENCOUNTER — Other Ambulatory Visit: Payer: Self-pay | Admitting: Physician Assistant

## 2020-06-12 DIAGNOSIS — I10 Essential (primary) hypertension: Secondary | ICD-10-CM

## 2020-06-12 DIAGNOSIS — E1165 Type 2 diabetes mellitus with hyperglycemia: Secondary | ICD-10-CM

## 2020-06-18 ENCOUNTER — Other Ambulatory Visit (HOSPITAL_COMMUNITY)
Admission: RE | Admit: 2020-06-18 | Discharge: 2020-06-18 | Disposition: A | Discharge status: Home / Self Care | Payer: 59 | Source: Ambulatory Visit | Attending: Physician Assistant | Admitting: Physician Assistant

## 2020-06-18 ENCOUNTER — Encounter: Payer: Self-pay | Admitting: Physician Assistant

## 2020-06-18 ENCOUNTER — Ambulatory Visit (INDEPENDENT_AMBULATORY_CARE_PROVIDER_SITE_OTHER): Payer: 59 | Admitting: Physician Assistant

## 2020-06-18 VITALS — BP 129/65 | HR 89 | Ht 67.0 in | Wt 333.0 lb

## 2020-06-18 DIAGNOSIS — E559 Vitamin D deficiency, unspecified: Secondary | ICD-10-CM

## 2020-06-18 DIAGNOSIS — Z124 Encounter for screening for malignant neoplasm of cervix: Secondary | ICD-10-CM | POA: Insufficient documentation

## 2020-06-18 DIAGNOSIS — E785 Hyperlipidemia, unspecified: Secondary | ICD-10-CM

## 2020-06-18 DIAGNOSIS — I10 Essential (primary) hypertension: Secondary | ICD-10-CM

## 2020-06-18 DIAGNOSIS — E1165 Type 2 diabetes mellitus with hyperglycemia: Secondary | ICD-10-CM | POA: Diagnosis not present

## 2020-06-18 DIAGNOSIS — Z0000 Encounter for general adult medical examination without abnormal findings: Secondary | ICD-10-CM

## 2020-06-18 DIAGNOSIS — Z1211 Encounter for screening for malignant neoplasm of colon: Secondary | ICD-10-CM

## 2020-06-18 LAB — POCT GLYCOSYLATED HEMOGLOBIN (HGB A1C): Hemoglobin A1C: 10.8 % — AB (ref 4.0–5.6)

## 2020-06-18 MED ORDER — OLMESARTAN MEDOXOMIL-HCTZ 40-12.5 MG PO TABS: 1.0000 | ORAL_TABLET | Freq: Every day | ORAL | 1 refills | Status: DC

## 2020-06-18 MED ORDER — SYNJARDY XR 10-1000 MG PO TB24: 1.0000 | ORAL_TABLET | Freq: Every day | ORAL | 0 refills | Status: DC

## 2020-06-18 MED ORDER — SEMAGLUTIDE(0.25 OR 0.5MG/DOS) 2 MG/1.5ML ~~LOC~~ SOPN: 1.0000 mg | PEN_INJECTOR | SUBCUTANEOUS | 0 refills | Status: DC

## 2020-06-18 NOTE — Patient Instructions (Signed)
Health Maintenance, Female Adopting a healthy lifestyle and getting preventive care are important in promoting health and wellness. Ask your health care provider about:  The right schedule for you to have regular tests and exams.  Things you can do on your own to prevent diseases and keep yourself healthy. What should I know about diet, weight, and exercise? Eat a healthy diet   Eat a diet that includes plenty of vegetables, fruits, low-fat dairy products, and lean protein.  Do not eat a lot of foods that are high in solid fats, added sugars, or sodium. Maintain a healthy weight Body mass index (BMI) is used to identify weight problems. It estimates body fat based on height and weight. Your health care provider can help determine your BMI and help you achieve or maintain a healthy weight. Get regular exercise Get regular exercise. This is one of the most important things you can do for your health. Most adults should:  Exercise for at least 150 minutes each week. The exercise should increase your heart rate and make you sweat (moderate-intensity exercise).  Do strengthening exercises at least twice a week. This is in addition to the moderate-intensity exercise.  Spend less time sitting. Even light physical activity can be beneficial. Watch cholesterol and blood lipids Have your blood tested for lipids and cholesterol at 46 years of age, then have this test every 5 years. Have your cholesterol levels checked more often if:  Your lipid or cholesterol levels are high.  You are older than 46 years of age.  You are at high risk for heart disease. What should I know about cancer screening? Depending on your health history and family history, you may need to have cancer screening at various ages. This may include screening for:  Breast cancer.  Cervical cancer.  Colorectal cancer.  Skin cancer.  Lung cancer. What should I know about heart disease, diabetes, and high blood  pressure? Blood pressure and heart disease  High blood pressure causes heart disease and increases the risk of stroke. This is more likely to develop in people who have high blood pressure readings, are of African descent, or are overweight.  Have your blood pressure checked: ? Every 3-5 years if you are 18-39 years of age. ? Every year if you are 40 years old or older. Diabetes Have regular diabetes screenings. This checks your fasting blood sugar level. Have the screening done:  Once every three years after age 40 if you are at a normal weight and have a low risk for diabetes.  More often and at a younger age if you are overweight or have a high risk for diabetes. What should I know about preventing infection? Hepatitis B If you have a higher risk for hepatitis B, you should be screened for this virus. Talk with your health care provider to find out if you are at risk for hepatitis B infection. Hepatitis C Testing is recommended for:  Everyone born from 1945 through 1965.  Anyone with known risk factors for hepatitis C. Sexually transmitted infections (STIs)  Get screened for STIs, including gonorrhea and chlamydia, if: ? You are sexually active and are younger than 46 years of age. ? You are older than 46 years of age and your health care provider tells you that you are at risk for this type of infection. ? Your sexual activity has changed since you were last screened, and you are at increased risk for chlamydia or gonorrhea. Ask your health care provider if   you are at risk.  Ask your health care provider about whether you are at high risk for HIV. Your health care provider may recommend a prescription medicine to help prevent HIV infection. If you choose to take medicine to prevent HIV, you should first get tested for HIV. You should then be tested every 3 months for as long as you are taking the medicine. Pregnancy  If you are about to stop having your period (premenopausal) and  you may become pregnant, seek counseling before you get pregnant.  Take 400 to 800 micrograms (mcg) of folic acid every day if you become pregnant.  Ask for birth control (contraception) if you want to prevent pregnancy. Osteoporosis and menopause Osteoporosis is a disease in which the bones lose minerals and strength with aging. This can result in bone fractures. If you are 65 years old or older, or if you are at risk for osteoporosis and fractures, ask your health care provider if you should:  Be screened for bone loss.  Take a calcium or vitamin D supplement to lower your risk of fractures.  Be given hormone replacement therapy (HRT) to treat symptoms of menopause. Follow these instructions at home: Lifestyle  Do not use any products that contain nicotine or tobacco, such as cigarettes, e-cigarettes, and chewing tobacco. If you need help quitting, ask your health care provider.  Do not use street drugs.  Do not share needles.  Ask your health care provider for help if you need support or information about quitting drugs. Alcohol use  Do not drink alcohol if: ? Your health care provider tells you not to drink. ? You are pregnant, may be pregnant, or are planning to become pregnant.  If you drink alcohol: ? Limit how much you use to 0-1 drink a day. ? Limit intake if you are breastfeeding.  Be aware of how much alcohol is in your drink. In the [place] one drink equals one 12 oz bottle of beer (355 mL), one 5 oz glass of wine (148 mL), or one 1 oz glass of hard liquor (44 mL). General instructions  Schedule regular health, dental, and eye exams.  Stay current with your vaccines.  Tell your health care provider if: ? You often feel depressed. ? You have ever been abused or do not feel safe at home. Summary  Adopting a healthy lifestyle and getting preventive care are important in promoting health and wellness.  Follow your health care provider's instructions about healthy  diet, exercising, and getting tested or screened for diseases.  Follow your health care provider's instructions on monitoring your cholesterol and blood pressure. This information is not intended to replace advice given to you by your health care provider. Make sure you discuss any questions you have with your health care provider. Document Revised: 11/06/2018 Document Reviewed: 11/06/2018 Elsevier Patient Education  2020 Elsevier Inc.  

## 2020-06-18 NOTE — Progress Notes (Signed)
Subjective:     Cynthia Lee is a 44 y.o. female and is here for a comprehensive physical exam. The patient reports problems - see below. .  Pt admits to not taking tresbia. She is not checking her sugars regularly.she reports when she has checked before her sugars were around 130 in am.  She is watching her foods but not exercising.    Social History   Socioeconomic History  . Marital status: Single    Spouse name: Not on file  . Number of children: Not on file  . Years of education: Not on file  . Highest education level: Not on file  Occupational History  . Not on file  Tobacco Use  . Smoking status: Never Smoker  . Smokeless tobacco: Never Used  Substance and Sexual Activity  . Alcohol use: Not on file  . Drug use: Not on file  . Sexual activity: Not on file  Other Topics Concern  . Not on file  Social History Narrative  . Not on file   Social Determinants of Health   Financial Resource Strain:   . Difficulty of Paying Living Expenses:   Food Insecurity:   . Worried About Charity fundraiser in the Last Year:   . Arboriculturist in the Last Year:   Transportation Needs:   . Film/video editor (Medical):   [name] Kitchen Lack of Transportation (Non-Medical):   Physical Activity:   . Days of Exercise per Week:   . Minutes of Exercise per Session:   Stress:   . Feeling of Stress :   Social Connections:   . Frequency of Communication with Friends and Family:   . Frequency of Social Gatherings with Friends and Family:   . Attends Religious Services:   . Active Member of Clubs or Organizations:   . Attends Archivist Meetings:   [name] Kitchen Marital Status:   Intimate Partner Violence:   . Fear of Current or Ex-Partner:   . Emotionally Abused:   [name] Kitchen Physically Abused:   . Sexually Abused:    Health Maintenance  Topic Date Due  . Hepatitis C Screening  Never done  . COVID-19 Vaccine (1) Never done  . OPHTHALMOLOGY EXAM  06/28/2018  . PAP SMEAR-Modifier  12/06/2019  .  FOOT EXAM  01/01/2020  . HEMOGLOBIN A1C  04/20/2020  . PNEUMOCOCCAL POLYSACCHARIDE VACCINE AGE 24-64 HIGH RISK  04/20/2021 (Originally 07/[birth year]/1976)  . TETANUS/TDAP  04/20/2021 (Originally 12/08/2019)  . INFLUENZA VACCINE  06/27/2020  . MAMMOGRAM  11/25/2020  . HIV Screening  Completed    The following portions of the patient's history were reviewed and updated as appropriate: allergies, current medications, past family history, past medical history, past social history, past surgical history and problem list.  Review of Systems Pertinent items noted in HPI and remainder of comprehensive ROS otherwise negative.   Objective:    BP (!) 129/65   Pulse 89   Ht 5\' 7"  (1.702 m)   Wt (!) 333 lb (151 kg)   SpO2 100%   BMI 52.16 kg/m  General appearance: alert, cooperative, appears stated age and morbidly obese Head: Normocephalic, without obvious abnormality, atraumatic Eyes: conjunctivae/corneas clear. PERRL, EOM's intact. Fundi benign. Ears: normal TM's and external ear canals both ears Nose: Nares normal. Septum midline. Mucosa normal. No drainage or sinus tenderness. Throat: lips, mucosa, and tongue normal; teeth and gums normal Neck: no adenopathy, no carotid bruit, no JVD, supple, symmetrical, trachea midline and thyroid not enlarged, symmetric, no  tenderness/mass/nodules Back: symmetric, no curvature. ROM normal. No CVA tenderness. Lungs: clear to auscultation bilaterally Breasts: normal appearance, no masses or tenderness Heart: regular rate and rhythm, S1, S2 normal, no murmur, click, rub or gallop Abdomen: soft, non-tender; bowel sounds normal; no masses,  no organomegaly Pelvic: cervix normal in appearance, external genitalia normal, no adnexal masses or tenderness, no cervical motion tenderness, uterus normal size, shape, and consistency and vagina normal without discharge Extremities: extremities normal, atraumatic, no cyanosis or edema Pulses: 2+ and symmetric Skin: Skin color,  texture, turgor normal. No rashes or lesions Lymph nodes: Cervical, supraclavicular, and axillary nodes normal. Neurologic: Alert and oriented X 3, normal strength and tone. Normal symmetric reflexes. Normal coordination and gait    Assessment:    Healthy female exam.      Plan:  [name] KitchenMarland KitchenDeshaun was seen today for annual exam.  Diagnoses and all orders for this visit:  Routine physical examination  Benign essential hypertension -     olmesartan-hydrochlorothiazide (BENICAR HCT) 40-12.5 MG tablet; Take 1 tablet by mouth daily.  Uncontrolled type 2 diabetes mellitus with hyperglycemia (HCC) -     Empagliflozin-metFORMIN HCl ER (SYNJARDY XR) 08-999 MG TB24; Take 1 tablet by mouth daily. -     Semaglutide,0.25 or 0.5MG /DOS, 2 MG/1.5ML SOPN; Inject 0.75 mLs (1 mg total) into the skin once a week. -     POCT glycosylated hemoglobin (Hb A1C)  Morbid obesity (HCC)  Vitamin D deficiency  Dyslipidemia, goal LDL below 70  Papanicolaou smear -     Cytology - PAP  .[name] Kitchen Lab Results  Component Value Date   HGBA1C 10.8 (A) 06/18/2020    Discussed 150 minutes of exercise a week.  Encouraged vitamin D 1000 units and Calcium 1300mg  or 4 servings of dairy a day.  Fasting labs ordered.  Needs eye exam.  Declined vaccines.  Pap done today. Declined STI screening.  Mammogram UTD.  New guidelines referral for colonoscopy.   A1c not to goal.  Increase ozempic to 1mg  daily weekly.  Add back tresbia 10units at bedtime.  Discussed diet and exercise.  On statin.  On BP medication. Refilled.   Follow up in 3 months.     See After Visit Summary for Counseling Recommendations

## 2020-06-21 ENCOUNTER — Other Ambulatory Visit: Payer: Self-pay | Admitting: Neurology

## 2020-06-21 LAB — CYTOLOGY - PAP
Comment: NEGATIVE
Diagnosis: NEGATIVE
High risk HPV: NEGATIVE

## 2020-06-21 MED ORDER — OZEMPIC (1 MG/DOSE) 2 MG/1.5ML ~~LOC~~ SOPN: 1.0000 mg | PEN_INJECTOR | SUBCUTANEOUS | 0 refills | Status: DC

## 2020-06-22 NOTE — Progress Notes (Signed)
Normal pap. Normal cells. Ok to go 5 years.

## 2020-07-22 ENCOUNTER — Encounter: Payer: Self-pay | Admitting: Internal Medicine

## 2020-08-24 ENCOUNTER — Telehealth: Payer: Self-pay

## 2020-08-24 NOTE — Telephone Encounter (Signed)
Dr. Tarri Glenn:  Pt has BMI of 52.14-on 06/18/20.  (She is a diabetic with 3 meds.)  She been increasing in size from 10/07/19 (51.36).  Please advise if you'd like an OV or a direct to [facility].  Thank you.

## 2020-08-25 ENCOUNTER — Other Ambulatory Visit: Payer: Self-pay

## 2020-08-25 DIAGNOSIS — Z1211 Encounter for screening for malignant neoplasm of colon: Secondary | ICD-10-CM

## 2020-08-25 NOTE — Telephone Encounter (Signed)
Pt scheduled for covid screen 10/08/20@9 :30am, Colon scheduled at [facility] 10/12/20 at 9:30am.

## 2020-08-25 NOTE — Telephone Encounter (Signed)
LMOM to pt to call back to discuss new procedure date and time

## 2020-08-25 NOTE — Telephone Encounter (Signed)
Direct to [facility]. Thank you.

## 2020-08-25 NOTE — Telephone Encounter (Signed)
Linda, Could you schedule this please?  Thanks, Rossana Molchan 

## 2020-08-26 NOTE — Telephone Encounter (Signed)
Spoke with pt.  She states she thinks she has lost weight, but will get an accurate weight at [facility].  I made her aware that we will do her procedure at the [facility] if BMI less than 50, but will do procedure at [facility] is BMI is greater than 50.  Both appts are made, but will cancel the one that is not needed after we weight her.  Understanding voiced.

## 2020-09-01 ENCOUNTER — Ambulatory Visit (AMBULATORY_SURGERY_CENTER): Payer: Self-pay | Admitting: *Deleted

## 2020-09-01 ENCOUNTER — Other Ambulatory Visit: Payer: Self-pay

## 2020-09-01 VITALS — Ht 67.5 in | Wt 330.6 lb

## 2020-09-01 DIAGNOSIS — Z1211 Encounter for screening for malignant neoplasm of colon: Secondary | ICD-10-CM

## 2020-09-01 DIAGNOSIS — Z01818 Encounter for other preprocedural examination: Secondary | ICD-10-CM

## 2020-09-01 MED ORDER — SUTAB 1479-225-188 MG PO TABS: 24.0000 | ORAL_TABLET | ORAL | 0 refills | Status: DC

## 2020-09-01 NOTE — Progress Notes (Signed)
No egg or soy allergy known to patient   issues with past sedation with any surgeries or procedures- hyperventilated with c section- didn't go to sleep well with general per pt  no intubation problems in the past  No FH of Malignant Hyperthermia No diet pills per patient No home 02 use per patient  No blood thinners per patient  Pt denies issues with constipation  No A fib or A flutter  EMMI video to pt or via MyChart  COVID 19 guidelines implemented in [facility] today with Pt and RN   BMI today is 51.02- I canceled the 11-1 [facility] colon- pt will do her Colon at [facility] as scheduled for 11-16 at 930 am   [place]  Coupon given to pt in [facility] today , Code to Pharmacy   Due to the COVID-19 pandemic we are asking patients to follow these guidelines. Please only bring one care partner. Please be aware that your care partner may wait in the car in the parking lot or if they feel like they will be too hot to wait in the car, they may wait in the lobby on the 4th floor. All care partners are required to wear a mask the entire time (we do not have any that we can provide them), they need to practice social distancing, and we will do a Covid check for all patient's and care partners when you arrive. Also we will check their temperature and your temperature. If the care partner waits in their car they need to stay in the parking lot the entire time and we will call them on their cell phone when the patient is ready for discharge so they can bring the car to the front of the building. Also all patient's will need to wear a mask into building.

## 2020-09-06 ENCOUNTER — Encounter: Payer: 59 | Admitting: Internal Medicine

## 2020-09-13 ENCOUNTER — Encounter: Payer: Self-pay | Admitting: Internal Medicine

## 2020-09-17 ENCOUNTER — Ambulatory Visit: Payer: 59 | Admitting: Physician Assistant

## 2020-09-27 ENCOUNTER — Encounter: Payer: Self-pay | Admitting: Gastroenterology

## 2020-10-08 ENCOUNTER — Other Ambulatory Visit (HOSPITAL_COMMUNITY)
Admission: RE | Admit: 2020-10-08 | Discharge: 2020-10-08 | Disposition: A | Discharge status: Home / Self Care | Payer: 59 | Source: Ambulatory Visit | Attending: Gastroenterology | Admitting: Gastroenterology

## 2020-10-08 DIAGNOSIS — Z20822 Contact with and (suspected) exposure to covid-19: Secondary | ICD-10-CM | POA: Diagnosis not present

## 2020-10-08 DIAGNOSIS — Z01812 Encounter for preprocedural laboratory examination: Secondary | ICD-10-CM | POA: Insufficient documentation

## 2020-10-08 LAB — SARS CORONAVIRUS 2 (TAT 6-24 HRS): SARS Coronavirus 2: NEGATIVE

## 2020-10-12 ENCOUNTER — Encounter (HOSPITAL_COMMUNITY): Payer: Self-pay | Admitting: Gastroenterology

## 2020-10-12 ENCOUNTER — Ambulatory Visit (HOSPITAL_COMMUNITY): Payer: 59 | Admitting: Anesthesiology

## 2020-10-12 ENCOUNTER — Encounter (HOSPITAL_COMMUNITY)
Admission: RE | Disposition: A | Discharge status: Home / Self Care | Payer: Self-pay | Source: Home / Self Care | Attending: Gastroenterology

## 2020-10-12 ENCOUNTER — Other Ambulatory Visit: Payer: Self-pay

## 2020-10-12 ENCOUNTER — Ambulatory Visit (HOSPITAL_COMMUNITY)
Admission: RE | Admit: 2020-10-12 | Discharge: 2020-10-12 | Disposition: A | Discharge status: Home / Self Care | Payer: 59 | Attending: Gastroenterology | Admitting: Gastroenterology

## 2020-10-12 DIAGNOSIS — Z880 Allergy status to penicillin: Secondary | ICD-10-CM | POA: Diagnosis not present

## 2020-10-12 DIAGNOSIS — K635 Polyp of colon: Secondary | ICD-10-CM

## 2020-10-12 DIAGNOSIS — D128 Benign neoplasm of rectum: Secondary | ICD-10-CM | POA: Insufficient documentation

## 2020-10-12 DIAGNOSIS — Z1211 Encounter for screening for malignant neoplasm of colon: Secondary | ICD-10-CM | POA: Insufficient documentation

## 2020-10-12 HISTORY — PX: POLYPECTOMY: SHX5525

## 2020-10-12 HISTORY — PX: COLONOSCOPY WITH PROPOFOL: SHX5780

## 2020-10-12 LAB — GLUCOSE, CAPILLARY: Glucose-Capillary: 151 mg/dL — ABNORMAL HIGH (ref 70–99)

## 2020-10-12 SURGERY — COLONOSCOPY WITH PROPOFOL
Anesthesia: Monitor Anesthesia Care

## 2020-10-12 MED ORDER — PROPOFOL 10 MG/ML IV BOLUS
INTRAVENOUS | Status: DC | PRN
  Administered 2020-10-12: 60 mg via INTRAVENOUS

## 2020-10-12 MED ORDER — PROPOFOL 500 MG/50ML IV EMUL
INTRAVENOUS | Status: DC | PRN
  Administered 2020-10-12: 140 ug/kg/min via INTRAVENOUS

## 2020-10-12 MED ORDER — SODIUM CHLORIDE 0.9 % IV SOLN: INTRAVENOUS | Status: DC

## 2020-10-12 MED ORDER — LACTATED RINGERS IV SOLN
INTRAVENOUS | Status: AC | PRN
  Administered 2020-10-12: 1000 mL via INTRAVENOUS

## 2020-10-12 SURGICAL SUPPLY — 22 items

## 2020-10-12 NOTE — Anesthesia Preprocedure Evaluation (Addendum)
Anesthesia Evaluation  Patient identified by MRN, date of birth, ID band Patient awake    Reviewed: Allergy & Precautions, H&P , NPO status , Patient's Chart, lab work & pertinent test results, reviewed documented beta blocker date and time   Airway Mallampati: III  TM Distance: >3 FB Neck ROM: full    Dental no notable dental hx. (+) Teeth Intact, Dental Advisory Given   Pulmonary neg pulmonary ROS,    Pulmonary exam normal breath sounds clear to auscultation       Cardiovascular Exercise Tolerance: Good hypertension, Pt. on medications  Rhythm:regular Rate:Normal     Neuro/Psych negative neurological ROS  negative psych ROS   GI/Hepatic negative GI ROS, Neg liver ROS,   Endo/Other  diabetes  Renal/GU negative Renal ROS  negative genitourinary   Musculoskeletal   Abdominal (+) + obese,   Peds  Hematology  (+) Blood dyscrasia, anemia ,   Anesthesia Other Findings   Reproductive/Obstetrics negative OB ROS                            Anesthesia Physical Anesthesia Plan  ASA: III  Anesthesia Plan: MAC   Post-op Pain Management:    Induction: Intravenous  PONV Risk Score and Plan: 3  Airway Management Planned: Nasal Cannula, Natural Airway, Simple Face Mask and Mask  Additional Equipment:   Intra-op Plan:   Post-operative Plan:   Informed Consent: I have reviewed the patients History and Physical, chart, labs and discussed the procedure including the risks, benefits and alternatives for the proposed anesthesia with the patient or authorized representative who has indicated his/her understanding and acceptance.     Dental Advisory Given  Plan Discussed with: CRNA  Anesthesia Plan Comments:         Anesthesia Quick Evaluation

## 2020-10-12 NOTE — Op Note (Signed)
[facility] Patient Name: Cynthia Lee Procedure Date: 10/12/2020 MRN: [id number] Attending MD: Thornton Park MD, MD Date of Birth: [birth year] CSN: [id number] Age: 44 Admit Type: Outpatient Procedure:                Colonoscopy Indications:              Screening for colorectal malignant neoplasm, This                            is the patient's first colonoscopy                           No known family history of colon cancer or polyps. Providers:                Thornton Park MD, MD, Benay Pillow, RN, Cherylynn Ridges, Technician, Stephanie [place] CRNA Referring MD:              Medicines:                Monitored Anesthesia Care Complications:            No immediate complications. Estimated blood loss:                            Minimal. Estimated Blood Loss:     Estimated blood loss was minimal. Procedure:                Pre-Anesthesia Assessment:                           - Prior to the procedure, a History and Physical                            was performed, and patient medications and                            allergies were reviewed. The patient's tolerance of                            previous anesthesia was also reviewed. The risks                            and benefits of the procedure and the sedation                            options and risks were discussed with the patient.                            All questions were answered, and informed consent                            was obtained. Prior Anticoagulants: The patient has  taken no previous anticoagulant or antiplatelet                            agents. ASA Grade Assessment: III - A patient with                            severe systemic disease. After reviewing the risks                            and benefits, the patient was deemed in                            satisfactory condition to undergo the procedure.                            After obtaining informed consent, the colonoscope                            was passed under direct vision. Throughout the                            procedure, the patient's blood pressure, pulse, and                            oxygen saturations were monitored continuously. The                            CF-HQ190L ([id number]) Olympus colonoscope was                            introduced through the anus and advanced to the 3                            cm into the ileum. A second forward view of the                            right colon was performed. The colonoscopy was                            performed without difficulty. The patient tolerated                            the procedure well. The quality of the bowel                            preparation was good. The terminal ileum, ileocecal                            valve, appendiceal orifice, and rectum were                            photographed. Scope In: 10:08:29 AM Scope Out: 10:25:47 AM Scope Withdrawal Time: 0 hours 14 minutes 49 seconds  Total Procedure Duration:  0 hours 17 minutes 18 seconds  Findings:      The perianal and digital rectal examinations were normal.      Two flat polyps were found in the rectum. The polyps were 2 mm in size.       These polyps were removed with a cold snare. Resection and retrieval       were complete. Estimated blood loss was minimal.      The exam was otherwise without abnormality on direct and retroflexion       views. Impression:               - Two 2 mm polyps in the rectum, removed with a                            cold snare. Resected and retrieved.                           - The examination was otherwise normal on direct                            and retroflexion views. Moderate Sedation:      Not Applicable - Patient had care per Anesthesia. Recommendation:           - Patient has a contact number available for                            emergencies. The signs and  symptoms of potential                            delayed complications were discussed with the                            patient. Return to normal activities tomorrow.                            Written discharge instructions were provided to the                            patient.                           - Resume previous diet.                           - Continue present medications.                           - Await pathology results.                           - Repeat colonoscopy date to be determined after                            pending pathology results are reviewed for                            surveillance.                           -  Emerging evidence supports eating a diet of                            fruits, vegetables, grains, calcium, and yogurt                            while reducing red meat and alcohol may reduce the                            risk of colon cancer.                           - Thank you for allowing me to be involved in your                            colon cancer prevention. Procedure Code(s):        --- Professional ---                           [phone], Colonoscopy, flexible; with removal of                            tumor(s), polyp(s), or other lesion(s) by snare                            technique Diagnosis Code(s):        --- Professional ---                           Z12.11, Encounter for screening for malignant                            neoplasm of colon                           K62.1, Rectal polyp CPT copyright 2019 American Medical Association. All rights reserved. The codes documented in this report are preliminary and upon coder review may  be revised to meet current compliance requirements. Thornton Park MD, MD 10/12/2020 10:33:49 AM This report has been signed electronically. Number of Addenda: 0

## 2020-10-12 NOTE — H&P (Signed)
Referring Provider: No ref. provider found Primary Care Physician:  Lavada Mesi  Reason for Consultation:  Colon cancer screening   IMPRESSION:  Colon cancer screening No known family history of colon cancer or polyps  PLAN: Colonoscopy  Please see the "Patient Instructions" section for addition details about the plan.  HPI: Cynthia Lee is a 44 y.o. female who presents for colon cancer screening. No prior endoscopy. Distant history of constipation. Some post-prandial stooling. GI ROS is otherwise negative.   No known family history of colon cancer or polyps. No family history of uterine/endometrial cancer, pancreatic cancer or gastric/stomach cancer.   Past Medical History:  Diagnosis Date  . Abnormal Pap smear, atypical squamous cells of undetermined sign (ASC-US)   . Allergy    started 2021  . Anemia   . Diabetes mellitus without complication ([place])   . Heart murmur    as a child   . Hypertension     Past Surgical History:  Procedure Laterality Date  . CESAREAN SECTION     x2  . CHOLECYSTECTOMY    . GALLBLADDER SURGERY    . skin graph  2006  . WISDOM TOOTH EXTRACTION      Current Facility-Administered Medications  Medication Dose Route Frequency Provider Last Rate Last Admin  . 0.9 %  sodium chloride infusion   Intravenous Continuous Thornton Park, MD      . lactated ringers infusion    Continuous PRN Thornton Park, MD   1,000 mL at 10/12/20 0858    Allergies as of 08/25/2020 - Review Complete 06/18/2020  Allergen Reaction Noted  . Penicillins Other (See Comments) 10/07/2012    Family History  Problem Relation Age of Onset  . Hyperlipidemia Mother        opiate resistant  . Hypertension Mother   . Alcohol abuse Father   . Diabetes Father   . Hypertension Father   . Hyperlipidemia Father   . Heart attack Father   . Stroke Paternal Grandmother   . Cancer Paternal Grandmother        ovarian cancer  . Diabetes Son   .  Schizophrenia Brother   . Colon cancer Neg Hx   . Colon polyps Neg Hx   . Esophageal cancer Neg Hx   . Rectal cancer Neg Hx   . Stomach cancer Neg Hx     Social History   Socioeconomic History  . Marital status: Single    Spouse name: Not on file  . Number of children: Not on file  . Years of education: Not on file  . Highest education level: Not on file  Occupational History  . Not on file  Tobacco Use  . Smoking status: Never Smoker  . Smokeless tobacco: Never Used  Substance and Sexual Activity  . Alcohol use: Never  . Drug use: Not Currently  . Sexual activity: Not on file  Other Topics Concern  . Not on file  Social History Narrative  . Not on file   Social Determinants of Health   Financial Resource Strain:   . Difficulty of Paying Living Expenses: Not on file  Food Insecurity:   . Worried About Charity fundraiser in the Last Year: Not on file  . Ran Out of Food in the Last Year: Not on file  Transportation Needs:   . Lack of Transportation (Medical): Not on file  . Lack of Transportation (Non-Medical): Not on file  Physical Activity:   . Days of Exercise  per Week: Not on file  . Minutes of Exercise per Session: Not on file  Stress:   . Feeling of Stress : Not on file  Social Connections:   . Frequency of Communication with Friends and Family: Not on file  . Frequency of Social Gatherings with Friends and Family: Not on file  . Attends Religious Services: Not on file  . Active Member of Clubs or Organizations: Not on file  . Attends Archivist Meetings: Not on file  . Marital Status: Not on file  Intimate Partner Violence:   . Fear of Current or Ex-Partner: Not on file  . Emotionally Abused: Not on file  . Physically Abused: Not on file  . Sexually Abused: Not on file    Review of Systems: 12 system ROS is negative except as noted above.   Physical Exam: General:   Alert,  well-nourished, pleasant and cooperative in NAD Head:   Normocephalic and atraumatic. Eyes:  Sclera clear, no icterus.   Conjunctiva pink. Ears:  Normal auditory acuity. Nose:  No deformity, discharge,  or lesions. Mouth:  No deformity or lesions.   Neck:  Supple; no masses or thyromegaly. Lungs:  Clear throughout to auscultation.   No wheezes. Heart:  Regular rate and rhythm; no murmurs. Abdomen:  Soft,nontender, nondistended, normal bowel sounds, no rebound or guarding. No hepatosplenomegaly.   Rectal:  Deferred  Msk:  Symmetrical. No boney deformities LAD: No inguinal or umbilical LAD Extremities:  No clubbing or edema. Neurologic:  Alert and  oriented x4;  grossly nonfocal Skin:  Intact without significant lesions or rashes. Psych:  Alert and cooperative. Normal mood and affect.   Neisha Hinger L. Tarri Glenn, MD, MPH 10/12/2020, 9:59 AM

## 2020-10-12 NOTE — Transfer of Care (Signed)
Immediate Anesthesia Transfer of Care Note  Patient: Cynthia Lee  Procedure(s) Performed: COLONOSCOPY WITH PROPOFOL (N/A ) POLYPECTOMY  Patient Location: PACU and Endoscopy Unit  Anesthesia Type:MAC  Level of Consciousness: awake, alert  and drowsy  Airway & Oxygen Therapy: Patient Spontanous Breathing  Post-op Assessment: Report given to RN and Post -op Vital signs reviewed and stable  Post vital signs: Reviewed and stable  Last Vitals:  Vitals Value Taken Time  BP 100/36 10/12/20 1031  Temp    Pulse 91 10/12/20 1032  Resp 22 10/12/20 1032  SpO2 94 % 10/12/20 1032  Vitals shown include unvalidated device data.  Last Pain:  Vitals:   10/12/20 0856  TempSrc: Oral  PainSc: 0-No pain         Complications: No complications documented.

## 2020-10-12 NOTE — Discharge Instructions (Signed)

## 2020-10-12 NOTE — Anesthesia Postprocedure Evaluation (Signed)
Anesthesia Post Note  Patient: Cynthia Lee  Procedure(s) Performed: COLONOSCOPY WITH PROPOFOL (N/A ) POLYPECTOMY     Anesthesia Post Evaluation No complications documented.  Last Vitals:  Vitals:   10/12/20 0856 10/12/20 1032  BP: (!) 165/91 (!) 79/43  Pulse:  92  Resp: 12 16  Temp: 36.6 C 37.1 C  SpO2: 98% 99%    Last Pain:  Vitals:   10/12/20 1032  TempSrc: Oral  PainSc: 0-No pain                 Mathews Stuhr

## 2020-10-13 ENCOUNTER — Encounter (HOSPITAL_COMMUNITY): Payer: Self-pay | Admitting: Gastroenterology

## 2020-10-13 ENCOUNTER — Encounter: Payer: Self-pay | Admitting: Gastroenterology

## 2020-10-13 LAB — SURGICAL PATHOLOGY

## 2020-10-18 ENCOUNTER — Other Ambulatory Visit: Payer: Self-pay | Admitting: Physician Assistant

## 2020-10-18 DIAGNOSIS — E785 Hyperlipidemia, unspecified: Secondary | ICD-10-CM

## 2020-12-10 ENCOUNTER — Encounter: Payer: Self-pay | Admitting: Physician Assistant

## 2021-01-06 ENCOUNTER — Other Ambulatory Visit: Payer: Self-pay | Admitting: Physician Assistant

## 2021-01-06 DIAGNOSIS — E1165 Type 2 diabetes mellitus with hyperglycemia: Secondary | ICD-10-CM

## 2021-02-11 ENCOUNTER — Other Ambulatory Visit: Payer: Self-pay | Admitting: Physician Assistant

## 2021-02-11 DIAGNOSIS — E785 Hyperlipidemia, unspecified: Secondary | ICD-10-CM

## 2021-02-11 DIAGNOSIS — E1165 Type 2 diabetes mellitus with hyperglycemia: Secondary | ICD-10-CM

## 2021-02-14 ENCOUNTER — Ambulatory Visit: Payer: 59 | Admitting: Physician Assistant

## 2021-02-14 ENCOUNTER — Other Ambulatory Visit: Payer: Self-pay

## 2021-02-14 VITALS — BP 130/78 | HR 72 | Ht 67.0 in | Wt 339.0 lb

## 2021-02-14 DIAGNOSIS — Z1159 Encounter for screening for other viral diseases: Secondary | ICD-10-CM | POA: Diagnosis not present

## 2021-02-14 DIAGNOSIS — Z6841 Body Mass Index (BMI) 40.0 and over, adult: Secondary | ICD-10-CM

## 2021-02-14 DIAGNOSIS — E785 Hyperlipidemia, unspecified: Secondary | ICD-10-CM | POA: Diagnosis not present

## 2021-02-14 DIAGNOSIS — E1165 Type 2 diabetes mellitus with hyperglycemia: Secondary | ICD-10-CM

## 2021-02-14 DIAGNOSIS — Z1231 Encounter for screening mammogram for malignant neoplasm of breast: Secondary | ICD-10-CM

## 2021-02-14 DIAGNOSIS — Z862 Personal history of diseases of the blood and blood-forming organs and certain disorders involving the immune mechanism: Secondary | ICD-10-CM

## 2021-02-14 LAB — POCT GLYCOSYLATED HEMOGLOBIN (HGB A1C): Hemoglobin A1C: 9.8 % — AB (ref 4.0–5.6)

## 2021-02-14 MED ORDER — PIOGLITAZONE HCL 15 MG PO TABS: 15.0000 mg | ORAL_TABLET | Freq: Every day | ORAL | 0 refills | Status: DC

## 2021-02-14 MED ORDER — SYNJARDY XR 10-1000 MG PO TB24: 1.0000 | ORAL_TABLET | Freq: Every day | ORAL | 0 refills | Status: DC

## 2021-02-14 MED ORDER — TRULICITY 0.75 MG/0.5ML ~~LOC~~ SOAJ: 0.7500 mg | SUBCUTANEOUS | 2 refills | Status: DC

## 2021-02-14 NOTE — Progress Notes (Signed)
Subjective:    Patient ID: Cynthia Lee, female    DOB: September 21, 1976, 45 y.o.   MRN: [id number]  HPI  Patient is a 45 year old obese female with hypertension, type 2 diabetes, dyslipidemia, anemia, vitamin D deficiency who presents to the clinic for follow-up.    Patient does have diabetes but she is not regularly following up for these appointments.  Patient reports to be relatively compliant with medications. Not using ozempic due to GI side effects. She is not checking her sugars regularly.  She denies any hypoglycemic symptoms.  She denies any open sores or wounds.  She is not actively keeping a diabetic diet and she is not exercising daily.  Patient is on Benicar HCT.  She denies any chest pain, palpitations, headaches, vision changes.   .. Active Ambulatory Problems    Diagnosis Date Noted  . History of anemia 10/09/2012  . Class 3 severe obesity due to excess calories with serious comorbidity and body mass index (BMI) of 50.0 to 59.9 in adult ([place]) 10/09/2012  . Varicose vein of leg 10/09/2012  . Abnormal Pap smear, atypical squamous cells of undetermined sign (ASC-US) 10/31/2012  . Elevated fasting glucose 11/04/2012  . Benign essential hypertension 11/04/2012  . Metatarsalgia with corn of right foot 09/16/2014  . Morbid obesity ([place]) 03/31/2015  . Uncontrolled type 2 diabetes mellitus without complication, without long-term current use of insulin 02/08/2016  . Yeast infection of the skin 12/05/2016  . Irritable bowel syndrome with diarrhea 12/05/2016  . Vitamin D deficiency 05/20/2018  . Dyslipidemia, goal LDL below 70 05/20/2018  . Perioral dermatitis 05/24/2018  . Numbness of left lower extremity 06/25/2018  . Uncontrolled type 2 diabetes mellitus with hyperglycemia ([place]) 10/07/2019  . Polyp of colon    Resolved Ambulatory Problems    Diagnosis Date Noted  . No Resolved Ambulatory Problems   Past Medical History:  Diagnosis Date  . Allergy   . Anemia   . Diabetes  mellitus without complication ([place])   . Heart murmur   . Hypertension       Review of Systems    see HPI.  Objective:   Physical Exam Vitals reviewed.  Constitutional:      Appearance: Normal appearance. She is obese.  HENT:     Head: Normocephalic.  Neck:     Vascular: No carotid bruit.  Cardiovascular:     Rate and Rhythm: Normal rate and regular rhythm.  Pulmonary:     Effort: Pulmonary effort is normal.     Breath sounds: Normal breath sounds.  Neurological:     General: No focal deficit present.     Mental Status: She is alert and oriented to person, place, and time.  Psychiatric:        Mood and Affect: Mood normal.       .. Results for orders placed or performed in visit on 02/14/21  POCT glycosylated hemoglobin (Hb A1C)  Result Value Ref Range   Hemoglobin A1C 9.8 (A) 4.0 - 5.6 %   HbA1c POC (<> result, manual entry)     HbA1c, POC (prediabetic range)     HbA1c, POC (controlled diabetic range)         Assessment & Plan:  [name] KitchenMarland KitchenJaylen was seen today for diabetes.  Diagnoses and all orders for this visit:  Uncontrolled type 2 diabetes mellitus with hyperglycemia ([place]) -     POCT glycosylated hemoglobin (Hb A1C) -     COMPLETE METABOLIC PANEL WITH GFR -  Dulaglutide (TRULICITY) 6.76 HM/0.9OB SOPN; Inject 0.75 mg into the skin once a week. -     Empagliflozin-metFORMIN HCl ER (SYNJARDY XR) 08-999 MG TB24; Take 1 tablet by mouth daily. -     pioglitazone (ACTOS) 15 MG tablet; Take 1 tablet (15 mg total) by mouth daily.  Dyslipidemia, goal LDL below 70 -     Lipid Panel w/reflex Direct LDL  Class 3 severe obesity due to excess calories with serious comorbidity and body mass index (BMI) of 50.0 to 59.9 in adult (HCC) -     TSH  Encounter for hepatitis C screening test for low risk patient -     Hepatitis C Antibody  History of anemia -     CBC  Visit for screening mammogram -     MM 3D SCREEN BREAST BILATERAL   A1c is 9.8 and not  controlled. Discussed importance of compliance and regular follow-up with patient. Discussed GI symptoms associated with GLP-1's. Stop Ozempic and try Trulicity 0.96.  I am hoping Trulicity might be more tolerable. Added Actos. Continue syndjardy.  BP very close to goal. On ARB. On statin. Needs labs.  Discussed need for eye exam.  Foot exam UTD. Declined flu/pneumonia/covid vaccine.  Follow up in 3 months.   Ordered mammogram.   ..Discussed low carb diet with 1500 calories and 80g of protein.  Exercising at least 150 minutes a week.  [employer] Pal could be a Microbiologist.

## 2021-02-22 ENCOUNTER — Encounter: Payer: Self-pay | Admitting: Physician Assistant

## 2021-03-09 LAB — HM DIABETES EYE EXAM

## 2021-03-10 ENCOUNTER — Other Ambulatory Visit: Payer: Self-pay | Admitting: Physician Assistant

## 2021-03-10 DIAGNOSIS — E785 Hyperlipidemia, unspecified: Secondary | ICD-10-CM

## 2021-03-16 LAB — CBC
HCT: 44.3 % (ref 35.0–45.0)
Hemoglobin: 14.1 g/dL (ref 11.7–15.5)
MCH: 27 pg (ref 27.0–33.0)
MCHC: 31.8 g/dL — ABNORMAL LOW (ref 32.0–36.0)
MCV: 84.7 fL (ref 80.0–100.0)
MPV: 11.1 fL (ref 7.5–12.5)
Platelets: 353 10*3/uL (ref 140–400)
RBC: 5.23 10*6/uL — ABNORMAL HIGH (ref 3.80–5.10)
RDW: 14.3 % (ref 11.0–15.0)
WBC: 7.7 10*3/uL (ref 3.8–10.8)

## 2021-03-16 LAB — COMPLETE METABOLIC PANEL WITH GFR
AG Ratio: 1.2 (calc) (ref 1.0–2.5)
ALT: 8 U/L (ref 6–29)
AST: 16 U/L (ref 10–35)
Albumin: 4 g/dL (ref 3.6–5.1)
Alkaline phosphatase (APISO): 77 U/L (ref 31–125)
BUN: 12 mg/dL (ref 7–25)
CO2: 29 mmol/L (ref 20–32)
Calcium: 9.8 mg/dL (ref 8.6–10.2)
Chloride: 101 mmol/L (ref 98–110)
Creat: 0.74 mg/dL (ref 0.50–1.10)
GFR, Est African American: 113 mL/min/{1.73_m2} (ref >=60)
GFR, Est Non African American: 97 mL/min/{1.73_m2} (ref >=60)
Globulin: 3.4 g/dL (calc) (ref 1.9–3.7)
Glucose, Bld: 195 mg/dL — ABNORMAL HIGH (ref 65–99)
Potassium: 4.2 mmol/L (ref 3.5–5.3)
Sodium: 139 mmol/L (ref 135–146)
Total Bilirubin: 0.4 mg/dL (ref 0.2–1.2)
Total Protein: 7.4 g/dL (ref 6.1–8.1)

## 2021-03-16 LAB — HEPATITIS C ANTIBODY
Hepatitis C Ab: NONREACTIVE
SIGNAL TO CUT-OFF: 0.03 (ref <1.00)

## 2021-03-16 LAB — LIPID PANEL W/REFLEX DIRECT LDL
Cholesterol: 161 mg/dL (ref <200)
HDL: 45 mg/dL — ABNORMAL LOW (ref >=50)
LDL Cholesterol (Calc): 91 mg/dL (calc)
Non-HDL Cholesterol (Calc): 116 mg/dL (calc) (ref <130)
Total CHOL/HDL Ratio: 3.6 (calc) (ref <5.0)
Triglycerides: 150 mg/dL — ABNORMAL HIGH (ref <150)

## 2021-03-16 LAB — TSH: TSH: 2.51 mIU/L

## 2021-03-18 ENCOUNTER — Other Ambulatory Visit: Payer: Self-pay | Admitting: Physician Assistant

## 2021-03-18 DIAGNOSIS — E785 Hyperlipidemia, unspecified: Secondary | ICD-10-CM

## 2021-03-18 MED ORDER — ATORVASTATIN CALCIUM 80 MG PO TABS: 80.0000 mg | ORAL_TABLET | Freq: Every day | ORAL | 3 refills | Status: DC

## 2021-03-18 NOTE — Progress Notes (Signed)
Cynthia Lee,   LDL not to goal of under 70. Increased lipitor to 80mg  and sent to pharmacy.

## 2021-04-14 ENCOUNTER — Other Ambulatory Visit: Payer: Self-pay | Admitting: Physician Assistant

## 2021-04-14 DIAGNOSIS — I10 Essential (primary) hypertension: Secondary | ICD-10-CM

## 2021-05-17 ENCOUNTER — Other Ambulatory Visit: Payer: Self-pay | Admitting: Physician Assistant

## 2021-05-18 ENCOUNTER — Other Ambulatory Visit: Payer: Self-pay

## 2021-05-18 ENCOUNTER — Ambulatory Visit (INDEPENDENT_AMBULATORY_CARE_PROVIDER_SITE_OTHER): Payer: 59

## 2021-05-18 DIAGNOSIS — Z1231 Encounter for screening mammogram for malignant neoplasm of breast: Secondary | ICD-10-CM | POA: Diagnosis not present

## 2021-05-19 NOTE — Progress Notes (Signed)
Normal mammogram. Follow up in one year.

## 2021-06-20 ENCOUNTER — Other Ambulatory Visit: Payer: Self-pay | Admitting: Physician Assistant

## 2021-06-20 DIAGNOSIS — E1165 Type 2 diabetes mellitus with hyperglycemia: Secondary | ICD-10-CM

## 2021-06-26 ENCOUNTER — Other Ambulatory Visit: Payer: Self-pay | Admitting: Physician Assistant

## 2021-06-26 DIAGNOSIS — E1165 Type 2 diabetes mellitus with hyperglycemia: Secondary | ICD-10-CM

## 2021-06-30 ENCOUNTER — Other Ambulatory Visit: Payer: Self-pay

## 2021-06-30 DIAGNOSIS — E1165 Type 2 diabetes mellitus with hyperglycemia: Secondary | ICD-10-CM

## 2021-06-30 MED ORDER — SYNJARDY XR 10-1000 MG PO TB24: 1.0000 | ORAL_TABLET | Freq: Every day | ORAL | 0 refills | Status: DC

## 2021-06-30 NOTE — Telephone Encounter (Signed)
LVM informing patient of being overdue for a follow up. I also stated that a 30 day refill was sent in and that it may be the last refill we'll be able to send without a follow up.

## 2021-06-30 NOTE — Telephone Encounter (Signed)
Patient is overdue for a follow up with [facility]. Gave 30 days of meds. Please call patient and get scheduled.

## 2021-07-05 ENCOUNTER — Ambulatory Visit (INDEPENDENT_AMBULATORY_CARE_PROVIDER_SITE_OTHER): Payer: 59 | Admitting: Physician Assistant

## 2021-07-05 DIAGNOSIS — Z5329 Procedure and treatment not carried out because of patient's decision for other reasons: Secondary | ICD-10-CM

## 2021-07-05 NOTE — Progress Notes (Signed)
No show

## 2021-08-13 ENCOUNTER — Other Ambulatory Visit: Payer: Self-pay | Admitting: Physician Assistant

## 2021-08-13 DIAGNOSIS — E1165 Type 2 diabetes mellitus with hyperglycemia: Secondary | ICD-10-CM

## 2021-08-16 ENCOUNTER — Other Ambulatory Visit: Payer: Self-pay | Admitting: Neurology

## 2021-08-16 DIAGNOSIS — E1165 Type 2 diabetes mellitus with hyperglycemia: Secondary | ICD-10-CM

## 2021-08-16 MED ORDER — SYNJARDY XR 10-1000 MG PO TB24: 1.0000 | ORAL_TABLET | Freq: Every day | ORAL | 0 refills | Status: DC

## 2021-08-19 ENCOUNTER — Ambulatory Visit: Payer: BC Managed Care – PPO | Admitting: Physician Assistant

## 2021-08-19 ENCOUNTER — Other Ambulatory Visit: Payer: Self-pay

## 2021-08-19 ENCOUNTER — Encounter: Payer: Self-pay | Admitting: Physician Assistant

## 2021-08-19 VITALS — BP 149/84 | HR 82 | Ht 67.0 in | Wt 351.0 lb

## 2021-08-19 DIAGNOSIS — I10 Essential (primary) hypertension: Secondary | ICD-10-CM

## 2021-08-19 DIAGNOSIS — E785 Hyperlipidemia, unspecified: Secondary | ICD-10-CM

## 2021-08-19 DIAGNOSIS — E1165 Type 2 diabetes mellitus with hyperglycemia: Secondary | ICD-10-CM

## 2021-08-19 DIAGNOSIS — Z6841 Body Mass Index (BMI) 40.0 and over, adult: Secondary | ICD-10-CM

## 2021-08-19 LAB — POCT GLYCOSYLATED HEMOGLOBIN (HGB A1C): Hemoglobin A1C: 9.6 % — AB (ref 4.0–5.6)

## 2021-08-19 MED ORDER — SYNJARDY XR 10-1000 MG PO TB24: 1.0000 | ORAL_TABLET | Freq: Every day | ORAL | 0 refills | Status: DC

## 2021-08-19 MED ORDER — PIOGLITAZONE HCL 30 MG PO TABS: 30.0000 mg | ORAL_TABLET | Freq: Every day | ORAL | 0 refills | Status: DC

## 2021-08-19 MED ORDER — OLMESARTAN MEDOXOMIL-HCTZ 40-12.5 MG PO TABS: 1.0000 | ORAL_TABLET | Freq: Every day | ORAL | 1 refills | Status: DC

## 2021-08-19 MED ORDER — TRULICITY 1.5 MG/0.5ML ~~LOC~~ SOAJ: 1.5000 mg | SUBCUTANEOUS | 0 refills | Status: DC

## 2021-08-19 NOTE — Progress Notes (Deleted)
   Subjective:    Patient ID: Cynthia Lee, female    DOB: July 12, 1976, 45 y.o.   MRN: [id number]  HPI 9.8   Review of Systems     Objective:   Physical Exam        Assessment & Plan:

## 2021-08-19 NOTE — Progress Notes (Signed)
Subjective:    Patient ID: Cynthia Lee, female    DOB: [birth year], 45 y.o.   MRN: [id number]  HPI Patient is a 45 year old obese female with type 2 diabetes uncontrolled, hypertension, hyperlipidemia who presents to the clinic for 25-month follow-up.  She was actually supposed to follow-up in 3 months but did not make it to that appointment.  Her last A1c was 9.8.  She is not checking her sugars.  She denies any hypoglycemic events.  She admits she has not keeping a diabetic diet and not regularly exercising.  She does intend to change that as she has just signed up for a exercise water aerobics class.  She denies any chest pain, palpitations, headache, vision changes or dizziness.  She does endorse that she is not completely compliant with medications for many reasons.  1 reason is the cost.  Patient denies any open sores or wounds.  Patient has had a recent eye exam and foot exam.   .. Active Ambulatory Problems    Diagnosis Date Noted   History of anemia 10/09/2012   Class 3 severe obesity due to excess calories with serious comorbidity and body mass index (BMI) of 50.0 to 59.9 in adult [facility]) 10/09/2012   Varicose vein of leg 10/09/2012   Abnormal Pap smear, atypical squamous cells of undetermined sign (ASC-US) 10/31/2012   Elevated fasting glucose 11/04/2012   Benign essential hypertension 11/04/2012   Metatarsalgia with corn of right foot 09/16/2014   Morbid obesity ([place]) 03/31/2015   Uncontrolled type 2 diabetes mellitus without complication, without long-term current use of insulin 02/08/2016   Yeast infection of the skin 12/05/2016   Irritable bowel syndrome with diarrhea 12/05/2016   Vitamin D deficiency 05/20/2018   Dyslipidemia, goal LDL below 70 05/20/2018   Perioral dermatitis 05/24/2018   Numbness of left lower extremity 06/25/2018   Uncontrolled type 2 diabetes mellitus with hyperglycemia ([place]) 10/07/2019   Polyp of colon    Resolved Ambulatory Problems    Diagnosis  Date Noted   No Resolved Ambulatory Problems   Past Medical History:  Diagnosis Date   Allergy    Anemia    Diabetes mellitus without complication ([place])    Heart murmur    Hypertension     Review of Systems See HPI.     Objective:   Physical Exam Vitals reviewed.  Constitutional:      Appearance: Normal appearance. She is obese.  HENT:     Head: Normocephalic.  Cardiovascular:     Rate and Rhythm: Normal rate and regular rhythm.     Pulses: Normal pulses.  Pulmonary:     Effort: Pulmonary effort is normal.     Breath sounds: Normal breath sounds.  Skin:    Comments: Bilateral varicose veins present.   Neurological:     General: No focal deficit present.     Mental Status: She is alert and oriented to person, place, and time.  Psychiatric:        Mood and Affect: Mood normal.      .. Results for orders placed or performed in visit on 08/19/21  POCT glycosylated hemoglobin (Hb A1C)  Result Value Ref Range   Hemoglobin A1C 9.6 (A) 4.0 - 5.6 %   HbA1c POC (<> result, manual entry)     HbA1c, POC (prediabetic range)     HbA1c, POC (controlled diabetic range)         Assessment & Plan:  [name] KitchenMarland KitchenAlfie was seen today for diabetes.  Diagnoses and  all orders for this visit:  Uncontrolled type 2 diabetes mellitus with hyperglycemia (HCC) -     POCT glycosylated hemoglobin (Hb A1C) -     COMPLETE METABOLIC PANEL WITH GFR -     pioglitazone (ACTOS) 30 MG tablet; Take 1 tablet (30 mg total) by mouth daily. -     Empagliflozin-metFORMIN HCl ER (SYNJARDY XR) 08-999 MG TB24; Take 1 tablet by mouth daily. -     Dulaglutide (TRULICITY) 1.5 DE/0.8XK SOPN; Inject 1.5 mg into the skin once a week.  Benign essential hypertension -     COMPLETE METABOLIC PANEL WITH GFR -     olmesartan-hydrochlorothiazide (BENICAR HCT) 40-12.5 MG tablet; Take 1 tablet by mouth daily.  Dyslipidemia, goal LDL below 70 -     Lipid Panel w/reflex Direct LDL  Class 3 severe obesity due to excess  calories with serious comorbidity and body mass index (BMI) of 50.0 to 59.9 in adult (HCC)   A1C uncontrolled but better than 6 months ago.  Discussed DM diet. She has considered bariatric surgery. She is going to make another appt to discuss this surgeon.  Increased trulicity to 1.5mg  weekly.  Increased actos to 30mg  daily. Continue synjardy 10/1000mg  daily.  Discussed importance of medication compliance.  Pt is going to start exercise more regularly.  BP not to goal. On ARB but been out of it for a week.  On statin. Increased lipitor in April. Needs recheck LDL to see if at under 70 goal.  Just had eye exam will get copy.  Foot exam UTD.  Declined covid/flu/pneumonia vaccine.  Follow up in 3 months.   Spent 30 minutes with patient discussing the importance of diet, medication in DM care.

## 2021-09-16 ENCOUNTER — Other Ambulatory Visit: Payer: Self-pay | Admitting: Physician Assistant

## 2021-10-06 ENCOUNTER — Encounter: Payer: Self-pay | Admitting: Physician Assistant

## 2021-10-06 ENCOUNTER — Other Ambulatory Visit: Payer: Self-pay

## 2021-10-06 ENCOUNTER — Ambulatory Visit: Payer: BC Managed Care – PPO | Admitting: Physician Assistant

## 2021-10-06 VITALS — BP 151/78 | HR 61 | Temp 97.8°F | Ht 67.0 in | Wt 358.0 lb

## 2021-10-06 DIAGNOSIS — I10 Essential (primary) hypertension: Secondary | ICD-10-CM

## 2021-10-06 DIAGNOSIS — W57XXXA Bitten or stung by nonvenomous insect and other nonvenomous arthropods, initial encounter: Secondary | ICD-10-CM

## 2021-10-06 MED ORDER — DOXYCYCLINE HYCLATE 100 MG PO TABS: 100.0000 mg | ORAL_TABLET | Freq: Two times a day (BID) | ORAL | 0 refills | Status: DC

## 2021-10-06 NOTE — Progress Notes (Signed)
   Subjective:    Patient ID: Cynthia Lee, female    DOB: 1976-09-25, 45 y.o.   MRN: [id number]  HPI Pt is a 45 yo obese female with T2DM, HTN who presents to the clinic with right buttock pain for 2 days. She noticed a little bump on the right buttock. She thinks she got bit by something. No fever, chills, nausea, vomiting, headache, body aches.   .. Active Ambulatory Problems    Diagnosis Date Noted   History of anemia 10/09/2012   Class 3 severe obesity due to excess calories with serious comorbidity and body mass index (BMI) of 50.0 to 59.9 in adult [facility]) 10/09/2012   Varicose vein of leg 10/09/2012   Abnormal Pap smear, atypical squamous cells of undetermined sign (ASC-US) 10/31/2012   Elevated fasting glucose 11/04/2012   Benign essential hypertension 11/04/2012   Metatarsalgia with corn of right foot 09/16/2014   Morbid obesity ([place]) 03/31/2015   Uncontrolled type 2 diabetes mellitus without complication, without long-term current use of insulin 02/08/2016   Yeast infection of the skin 12/05/2016   Irritable bowel syndrome with diarrhea 12/05/2016   Vitamin D deficiency 05/20/2018   Dyslipidemia, goal LDL below 70 05/20/2018   Perioral dermatitis 05/24/2018   Numbness of left lower extremity 06/25/2018   Uncontrolled type 2 diabetes mellitus with hyperglycemia ([place]) 10/07/2019   Polyp of colon    Resolved Ambulatory Problems    Diagnosis Date Noted   No Resolved Ambulatory Problems   Past Medical History:  Diagnosis Date   Allergy    Anemia    Diabetes mellitus without complication ([place])    Heart murmur    Hypertension     Review of Systems See HPI.     Objective:   Physical Exam Vitals reviewed.  Constitutional:      Appearance: Normal appearance. She is obese.  HENT:     Head: Normocephalic.  Cardiovascular:     Rate and Rhythm: Normal rate and regular rhythm.     Pulses: Normal pulses.  Pulmonary:     Effort: Pulmonary effort is normal.  Skin:     Comments: Right buttocks small central papule with scab with surrounding redness, tenderness, and slightly warm to touch.   Neurological:     General: No focal deficit present.     Mental Status: She is alert.  Psychiatric:        Mood and Affect: Mood normal.          Assessment & Plan:  [name] KitchenMarland KitchenVennesa was seen today for skin problem.  Diagnoses and all orders for this visit:  Bug bite, initial encounter -     doxycycline (VIBRA-TABS) 100 MG tablet; Take 1 tablet (100 mg total) by mouth 2 (two) times daily.  Benign essential hypertension   Pt is diabetic and concern for worsening infection. Start doxycycline and use warm compresses. [place] for tylenol.  BP elevated. Take medication and watch salt intake.  Follow up if symptoms worsening. Follow up in 1 month on BP.

## 2021-11-15 DIAGNOSIS — Z20822 Contact with and (suspected) exposure to covid-19: Secondary | ICD-10-CM | POA: Diagnosis not present

## 2021-11-22 ENCOUNTER — Ambulatory Visit: Payer: BC Managed Care – PPO | Admitting: Physician Assistant

## 2021-11-23 ENCOUNTER — Other Ambulatory Visit: Payer: Self-pay

## 2021-11-23 ENCOUNTER — Ambulatory Visit: Payer: BC Managed Care – PPO | Admitting: Physician Assistant

## 2021-11-23 VITALS — BP 144/91 | HR 79 | Ht 67.0 in | Wt 360.0 lb

## 2021-11-23 DIAGNOSIS — E785 Hyperlipidemia, unspecified: Secondary | ICD-10-CM | POA: Diagnosis not present

## 2021-11-23 DIAGNOSIS — E1165 Type 2 diabetes mellitus with hyperglycemia: Secondary | ICD-10-CM

## 2021-11-23 DIAGNOSIS — I10 Essential (primary) hypertension: Secondary | ICD-10-CM

## 2021-11-23 DIAGNOSIS — Z6841 Body Mass Index (BMI) 40.0 and over, adult: Secondary | ICD-10-CM

## 2021-11-23 LAB — POCT GLYCOSYLATED HEMOGLOBIN (HGB A1C): Hemoglobin A1C: 10.1 % — AB (ref 4.0–5.6)

## 2021-11-23 MED ORDER — TIRZEPATIDE 5 MG/0.5ML ~~LOC~~ SOAJ: 5.0000 mg | SUBCUTANEOUS | 0 refills | Status: DC

## 2021-11-23 MED ORDER — SYNJARDY XR 10-1000 MG PO TB24
1.0000 | ORAL_TABLET | Freq: Every day | ORAL | 0 refills | Status: DC
Start: 2021-11-23 — End: 2022-03-31

## 2021-11-23 MED ORDER — OLMESARTAN MEDOXOMIL-HCTZ 40-25 MG PO TABS: 1.0000 | ORAL_TABLET | Freq: Every day | ORAL | 0 refills | Status: DC

## 2021-11-23 MED ORDER — PIOGLITAZONE HCL 45 MG PO TABS: 45.0000 mg | ORAL_TABLET | Freq: Every day | ORAL | 0 refills | Status: DC

## 2021-11-23 NOTE — Patient Instructions (Addendum)
Stop trulicity Start mounjaro Diabetes Mellitus and Nutrition, Adult When you have diabetes, or diabetes mellitus, it is very important to have healthy eating habits because your blood sugar (glucose) levels are greatly affected by what you eat and drink. Eating healthy foods in the right amounts, at about the same times every day, can help you: Manage your blood glucose. Lower your risk of heart disease. Improve your blood pressure. Reach or maintain a healthy weight. What can affect my meal plan? Every person with diabetes is different, and each person has different needs for a meal plan. Your health care provider may recommend that you work with a dietitian to make a meal plan that is best for you. Your meal plan may vary depending on factors such as: The calories you need. The medicines you take. Your weight. Your blood glucose, blood pressure, and cholesterol levels. Your activity level. Other health conditions you have, such as heart or kidney disease. How do carbohydrates affect me? Carbohydrates, also called carbs, affect your blood glucose level more than any other type of food. Eating carbs raises the amount of glucose in your blood. It is important to know how many carbs you can safely have in each meal. This is different for every person. Your dietitian can help you calculate how many carbs you should have at each meal and for each snack. How does alcohol affect me? Alcohol can cause a decrease in blood glucose (hypoglycemia), especially if you use insulin or take certain diabetes medicines by mouth. Hypoglycemia can be a life-threatening condition. Symptoms of hypoglycemia, such as sleepiness, dizziness, and confusion, are similar to symptoms of having too much alcohol. Do not drink alcohol if: Your health care provider tells you not to drink. You are pregnant, may be pregnant, or are planning to become pregnant. If you drink alcohol: Limit how much you have to: 0-1 drink a day  for women. 0-2 drinks a day for men. Know how much alcohol is in your drink. In the [place] one drink equals one 12 oz bottle of beer (355 mL), one 5 oz glass of wine (148 mL), or one 1 oz glass of hard liquor (44 mL). Keep yourself hydrated with water, diet soda, or unsweetened iced tea. Keep in mind that regular soda, juice, and other mixers may contain a lot of sugar and must be counted as carbs. What are tips for following this plan? Reading food labels Start by checking the serving size on the Nutrition Facts label of packaged foods and drinks. The number of calories and the amount of carbs, fats, and other nutrients listed on the label are based on one serving of the item. Many items contain more than one serving per package. Check the total grams (g) of carbs in one serving. Check the number of grams of saturated fats and trans fats in one serving. Choose foods that have a low amount or none of these fats. Check the number of milligrams (mg) of salt (sodium) in one serving. Most people should limit total sodium intake to less than 2,300 mg per day. Always check the nutrition information of foods labeled as "low-fat" or "nonfat." These foods may be higher in added sugar or refined carbs and should be avoided. Talk to your dietitian to identify your daily goals for nutrients listed on the label. Shopping Avoid buying canned, pre-made, or processed foods. These foods tend to be high in fat, sodium, and added sugar. Shop around the outside edge of the grocery store. This  is where you will most often find fresh fruits and vegetables, bulk grains, fresh meats, and fresh dairy products. Cooking Use low-heat cooking methods, such as baking, instead of high-heat cooking methods, such as deep frying. Cook using healthy oils, such as olive, canola, or sunflower oil. Avoid cooking with butter, cream, or high-fat meats. Meal planning Eat meals and snacks regularly, preferably at the same times every  day. Avoid going long periods of time without eating. Eat foods that are high in fiber, such as fresh fruits, vegetables, beans, and whole grains. Eat 4-6 oz (112-168 g) of lean protein each day, such as lean meat, chicken, fish, eggs, or tofu. One ounce (oz) (28 g) of lean protein is equal to: 1 oz (28 g) of meat, chicken, or fish. 1 egg.  cup (62 g) of tofu. Eat some foods each day that contain healthy fats, such as avocado, nuts, seeds, and fish. What foods should I eat? Fruits Berries. Apples. Oranges. Peaches. Apricots. Plums. Grapes. Mangoes. Papayas. Pomegranates. Kiwi. Cherries. Vegetables Leafy greens, including lettuce, spinach, kale, chard, collard greens, mustard greens, and cabbage. Beets. Cauliflower. Broccoli. Carrots. Green beans. Tomatoes. Peppers. Onions. Cucumbers. Brussels sprouts. Grains Whole grains, such as whole-wheat or whole-grain bread, crackers, tortillas, cereal, and pasta. Unsweetened oatmeal. Quinoa. Brown or wild rice. Meats and other proteins Seafood. Poultry without skin. Lean cuts of poultry and beef. Tofu. Nuts. Seeds. Dairy Low-fat or fat-free dairy products such as milk, yogurt, and cheese. The items listed above may not be a complete list of foods and beverages you can eat and drink. Contact a dietitian for more information. What foods should I avoid? Fruits Fruits canned with syrup. Vegetables Canned vegetables. Frozen vegetables with butter or cream sauce. Grains Refined white flour and flour products such as bread, pasta, snack foods, and cereals. Avoid all processed foods. Meats and other proteins Fatty cuts of meat. Poultry with skin. Breaded or fried meats. Processed meat. Avoid saturated fats. Dairy Full-fat yogurt, cheese, or milk. Beverages Sweetened drinks, such as soda or iced tea. The items listed above may not be a complete list of foods and beverages you should avoid. Contact a dietitian for more information. Questions to ask a  health care provider Do I need to meet with a certified diabetes care and education specialist? Do I need to meet with a dietitian? What number can I call if I have questions? When are the best times to check my blood glucose? Where to find more information: American Diabetes Association: diabetes.org Academy of Nutrition and Dietetics: eatright.[employer] of Diabetes and Digestive and Kidney Diseases: [employer]is Association of Diabetes Care & Education Specialists: diabeteseducator.org Summary It is important to have healthy eating habits because your blood sugar (glucose) levels are greatly affected by what you eat and drink. It is important to use alcohol carefully. A healthy meal plan will help you manage your blood glucose and lower your risk of heart disease. Your health care provider may recommend that you work with a dietitian to make a meal plan that is best for you. This information is not intended to replace advice given to you by your health care provider. Make sure you discuss any questions you have with your health care provider. Document Revised: 06/16/2020 Document Reviewed: 06/16/2020 Elsevier Patient Education  [place].

## 2021-11-24 LAB — COMPLETE METABOLIC PANEL WITH GFR
AG Ratio: 1.1 (calc) (ref 1.0–2.5)
ALT: 9 U/L (ref 6–29)
AST: 15 U/L (ref 10–35)
Albumin: 4.1 g/dL (ref 3.6–5.1)
Alkaline phosphatase (APISO): 88 U/L (ref 31–125)
BUN: 15 mg/dL (ref 7–25)
CO2: 28 mmol/L (ref 20–32)
Calcium: 9.9 mg/dL (ref 8.6–10.2)
Chloride: 96 mmol/L — ABNORMAL LOW (ref 98–110)
Creat: 0.8 mg/dL (ref 0.50–0.99)
Globulin: 3.6 g/dL (calc) (ref 1.9–3.7)
Glucose, Bld: 261 mg/dL — ABNORMAL HIGH (ref 65–99)
Potassium: 4.6 mmol/L (ref 3.5–5.3)
Sodium: 138 mmol/L (ref 135–146)
Total Bilirubin: 0.5 mg/dL (ref 0.2–1.2)
Total Protein: 7.7 g/dL (ref 6.1–8.1)
eGFR: 91 mL/min/{1.73_m2} (ref >=60)

## 2021-11-24 LAB — LIPID PANEL W/REFLEX DIRECT LDL
Cholesterol: 177 mg/dL (ref <200)
HDL: 43 mg/dL — ABNORMAL LOW (ref >=50)
LDL Cholesterol (Calc): 104 mg/dL (calc) — ABNORMAL HIGH
Non-HDL Cholesterol (Calc): 134 mg/dL (calc) — ABNORMAL HIGH (ref <130)
Total CHOL/HDL Ratio: 4.1 (calc) (ref <5.0)
Triglycerides: 181 mg/dL — ABNORMAL HIGH (ref <150)

## 2021-11-24 NOTE — Progress Notes (Signed)
Your cholesterol did increase. You ARE taking lipitor 80mg  daily? Goal LDL is under 70.

## 2021-11-25 ENCOUNTER — Encounter: Payer: Self-pay | Admitting: Physician Assistant

## 2021-11-25 NOTE — Progress Notes (Signed)
Subjective:    Patient ID: Cynthia Lee, female    DOB: August 08, 1976, 45 y.o.   MRN: [id number]  HPI Pt is a 45 yo obese female with T2DM, HTN, HLD who presents to the clinic for 3 month follow up.   Pt is checking her sugars in morning and ranging from 120-150. No hypoglycemic events. No open sores or wounds. No CP, palpitations, headaches or vision changes. She is walking more and making better food choices. She is taking her medications as prescribed.   .. Active Ambulatory Problems    Diagnosis Date Noted   History of anemia 10/09/2012   Class 3 severe obesity due to excess calories with serious comorbidity and body mass index (BMI) of 50.0 to 59.9 in adult [facility]) 10/09/2012   Varicose vein of leg 10/09/2012   Abnormal Pap smear, atypical squamous cells of undetermined sign (ASC-US) 10/31/2012   Elevated fasting glucose 11/04/2012   Benign essential hypertension 11/04/2012   Metatarsalgia with corn of right foot 09/16/2014   Morbid obesity ([place]) 03/31/2015   Uncontrolled type 2 diabetes mellitus without complication, without long-term current use of insulin 02/08/2016   Yeast infection of the skin 12/05/2016   Irritable bowel syndrome with diarrhea 12/05/2016   Vitamin D deficiency 05/20/2018   Dyslipidemia, goal LDL below 70 05/20/2018   Perioral dermatitis 05/24/2018   Numbness of left lower extremity 06/25/2018   Uncontrolled type 2 diabetes mellitus with hyperglycemia ([place]) 10/07/2019   Polyp of colon    Resolved Ambulatory Problems    Diagnosis Date Noted   No Resolved Ambulatory Problems   Past Medical History:  Diagnosis Date   Allergy    Anemia    Diabetes mellitus without complication ([place])    Heart murmur    Hypertension        Review of Systems See HPI.     Objective:   Physical Exam Vitals reviewed.  Constitutional:      Appearance: Normal appearance.  HENT:     Head: Normocephalic.  Cardiovascular:     Rate and Rhythm: Normal rate and regular  rhythm.  Pulmonary:     Effort: Pulmonary effort is normal.     Breath sounds: Normal breath sounds.  Neurological:     General: No focal deficit present.     Mental Status: She is alert and oriented to person, place, and time.  Psychiatric:        Mood and Affect: Mood normal.        Behavior: Behavior normal.      .. Depression screen [facility] 2/9 11/23/2021 06/18/2020 12/31/2018 05/17/2018  Decreased Interest 0 0 0 0  Down, Depressed, Hopeless 0 0 0 0  PHQ - 2 Score 0 0 0 0  Altered sleeping - 1 1 2   Tired, decreased energy - 0 1 2  Change in appetite - 1 1 1   Feeling bad or failure about yourself  - 0 0 0  Trouble concentrating - 0 0 0  Moving slowly or fidgety/restless - 0 0 0  Suicidal thoughts - 0 0 0  PHQ-9 Score - 2 3 5   Difficult doing work/chores - Not difficult at all Not difficult at all Not difficult at all   .[name] Kitchen GAD 7 : Generalized Anxiety Score 06/18/2020 12/31/2018 05/17/2018  Nervous, Anxious, on Edge 1 0 1  Control/stop worrying 0 0 0  Worry too much - different things 0 0 1  Trouble relaxing 0 0 0  Restless 0 0 1  Easily  annoyed or irritable 1 0 0  Afraid - awful might happen 1 0 0  Total GAD 7 Score 3 0 3  Anxiety Difficulty Not difficult at all Not difficult at all Not difficult at all    .[name] Kitchen Results for orders placed or performed in visit on 11/23/21  POCT glycosylated hemoglobin (Hb A1C)  Result Value Ref Range   Hemoglobin A1C 10.1 (A) 4.0 - 5.6 %   HbA1c POC (<> result, manual entry)     HbA1c, POC (prediabetic range)     HbA1c, POC (controlled diabetic range)         Assessment & Plan:  [name] KitchenMarland KitchenJasey was seen today for diabetes.  Diagnoses and all orders for this visit:  Uncontrolled type 2 diabetes mellitus with hyperglycemia ([place]) -     POCT glycosylated hemoglobin (Hb A1C) -     pioglitazone (ACTOS) 45 MG tablet; Take 1 tablet (45 mg total) by mouth daily. -     Empagliflozin-metFORMIN HCl ER (SYNJARDY XR) 08-999 MG TB24; Take 1 tablet by mouth  daily. -     tirzepatide [facility]) 5 MG/0.5ML Pen; Inject 5 mg into the skin once a week.  Benign essential hypertension -     olmesartan-hydrochlorothiazide (BENICAR HCT) 40-25 MG tablet; Take 1 tablet by mouth daily.  Dyslipidemia, goal LDL below 70  Class 3 severe obesity due to excess calories with serious comorbidity and body mass index (BMI) of 50.0 to 59.9 in adult ([facility])   A1C NOT to goal Discussed diet and nutrition. HO given on foods with high glycemic index Stop trulicity Start mounjaro discussed side effects-gave coupon card Increased actos to 45mg  Continue synjardy BP not to goal 2nd recheck BP went down-increased benicar/HCT today On statin-recheck labs Eye exam UtD Foot exam UTD.  Declines covid, flu, pneumonia vaccine. Follow up in 3 months.   If no decrease in next 3 months will have to consider insulin.

## 2021-12-03 ENCOUNTER — Telehealth: Payer: Self-pay

## 2021-12-03 NOTE — Telephone Encounter (Signed)
Medication: tirzepatide Cynthia Lee) 5 MG/0.5ML Pen Prior authorization submitted via CoverMyMeds on 12/03/2021 [place] submission pending

## 2021-12-05 NOTE — Telephone Encounter (Signed)
Fax received from [place] [facility] [place] requesting medical records for [place]. Last OV notes and a copy of HgbA1C results printed and faxed to [phone]. Fax confirmation form received.

## 2021-12-07 NOTE — Telephone Encounter (Signed)
Medication: tirzepatide Darcel Bayley) 5 MG/0.5ML Pen Prior authorization determination received Medication has been denied Reason for denial:  "The member must try and fail (did not work), or be unable to take ALL formulary alternatives (due to interactions, side effects, etc.). The member has tried Research officer, trade union and Trulicity. Alternative medications include Bydureon, Rybelsus, and Victoza."

## 2022-02-22 ENCOUNTER — Ambulatory Visit: Payer: BC Managed Care – PPO | Admitting: Physician Assistant

## 2022-02-28 ENCOUNTER — Ambulatory Visit: Payer: BC Managed Care – PPO | Admitting: Physician Assistant

## 2022-02-28 DIAGNOSIS — E1165 Type 2 diabetes mellitus with hyperglycemia: Secondary | ICD-10-CM

## 2022-03-31 ENCOUNTER — Ambulatory Visit: Payer: BC Managed Care – PPO | Admitting: Physician Assistant

## 2022-03-31 ENCOUNTER — Encounter: Payer: Self-pay | Admitting: Physician Assistant

## 2022-03-31 VITALS — BP 130/71 | HR 78 | Ht 67.0 in | Wt 356.0 lb

## 2022-03-31 DIAGNOSIS — I10 Essential (primary) hypertension: Secondary | ICD-10-CM

## 2022-03-31 DIAGNOSIS — E1165 Type 2 diabetes mellitus with hyperglycemia: Secondary | ICD-10-CM

## 2022-03-31 DIAGNOSIS — B372 Candidiasis of skin and nail: Secondary | ICD-10-CM | POA: Diagnosis not present

## 2022-03-31 DIAGNOSIS — Z23 Encounter for immunization: Secondary | ICD-10-CM

## 2022-03-31 DIAGNOSIS — Z6841 Body Mass Index (BMI) 40.0 and over, adult: Secondary | ICD-10-CM

## 2022-03-31 DIAGNOSIS — E785 Hyperlipidemia, unspecified: Secondary | ICD-10-CM

## 2022-03-31 LAB — POCT GLYCOSYLATED HEMOGLOBIN (HGB A1C): Hemoglobin A1C: 9.7 % — AB (ref 4.0–5.6)

## 2022-03-31 MED ORDER — SYNJARDY XR 10-1000 MG PO TB24: 1.0000 | ORAL_TABLET | Freq: Every day | ORAL | 0 refills | Status: DC

## 2022-03-31 MED ORDER — TIRZEPATIDE 7.5 MG/0.5ML ~~LOC~~ SOAJ: 7.5000 mg | SUBCUTANEOUS | 0 refills | Status: DC

## 2022-03-31 MED ORDER — MOUNJARO 2.5 MG/0.5ML ~~LOC~~ SOAJ: 2.5000 mg | SUBCUTANEOUS | 0 refills | Status: DC

## 2022-03-31 MED ORDER — OLMESARTAN MEDOXOMIL-HCTZ 40-25 MG PO TABS: 1.0000 | ORAL_TABLET | Freq: Every day | ORAL | 2 refills | Status: DC

## 2022-03-31 MED ORDER — PIOGLITAZONE HCL 45 MG PO TABS: 45.0000 mg | ORAL_TABLET | Freq: Every day | ORAL | 0 refills | Status: DC

## 2022-03-31 MED ORDER — TIRZEPATIDE 5 MG/0.5ML ~~LOC~~ SOAJ: 5.0000 mg | SUBCUTANEOUS | 0 refills | Status: DC

## 2022-03-31 MED ORDER — NYSTATIN-TRIAMCINOLONE 100000-0.1 UNIT/GM-% EX CREA: TOPICAL_CREAM | Freq: Two times a day (BID) | CUTANEOUS | 1 refills | Status: DC

## 2022-04-03 ENCOUNTER — Telehealth: Payer: Self-pay

## 2022-04-03 NOTE — Telephone Encounter (Signed)
Initiated Prior authorization HXT:[id number] 2.'5MG'$ /0.5ML pen-injectors ?Via: Covermymeds ?Case/Key:[id number] ?Status: approved as of 04/03/22 ?Reason:Effective from 04/03/2022 through 04/02/2023. ?Notified Pt via: Mychart ,called left voicemail ?

## 2022-04-07 ENCOUNTER — Encounter: Payer: Self-pay | Admitting: Physician Assistant

## 2022-04-07 NOTE — Progress Notes (Signed)
? ?Established Patient Office Visit ? ?Subjective   ?Patient ID: Cynthia Lee, female    DOB: [birth year]  Age: 46 y.o. MRN: [id number] ? ?Chief Complaint  ?Patient presents with  ? Follow-up  ? ? ?HPI ?Pt is a 46 yo obese female with HTN, T2DM, HLD, vitamin D def who presents to the clinic for medication refills and follow up. ? ?Insurance did not cover mounjaro and did not start. She has been taking synjardy and actos. Not checking sugars. No hypoglycemic symptoms. No CP, palpitations, headaches or vision changes. She has been "active" but not exercising. No open sores or wounds. Recurrent yeast infections in skin folds needs refill on cream.  ? ?.. ?Active Ambulatory Problems  ?  Diagnosis Date Noted  ? History of anemia 10/09/2012  ? Class 3 severe obesity due to excess calories with serious comorbidity and body mass index (BMI) of 50.0 to 59.9 in adult [facility]) 10/09/2012  ? Varicose vein of leg 10/09/2012  ? Abnormal Pap smear, atypical squamous cells of undetermined sign (ASC-US) 10/31/2012  ? Elevated fasting glucose 11/04/2012  ? Benign essential hypertension 11/04/2012  ? Metatarsalgia with corn of right foot 09/16/2014  ? Morbid obesity ([place]) 03/31/2015  ? Uncontrolled type 2 diabetes mellitus without complication, without long-term current use of insulin 02/08/2016  ? Yeast infection of the skin 12/05/2016  ? Irritable bowel syndrome with diarrhea 12/05/2016  ? Vitamin D deficiency 05/20/2018  ? Dyslipidemia, goal LDL below 70 05/20/2018  ? Perioral dermatitis 05/24/2018  ? Numbness of left lower extremity 06/25/2018  ? Uncontrolled type 2 diabetes mellitus with hyperglycemia ([place]) 10/07/2019  ? Polyp of colon   ? ?Resolved Ambulatory Problems  ?  Diagnosis Date Noted  ? No Resolved Ambulatory Problems  ? ?Past Medical History:  ?Diagnosis Date  ? Allergy   ? Anemia   ? Diabetes mellitus without complication ([place])   ? Heart murmur   ? Hypertension   ? ? ? ?Review of Systems  ?All other systems reviewed and  are negative. ? ?  ?Objective:  ?  ? ?BP 130/71 (BP Location: Left Arm, Patient Position: Sitting, Cuff Size: Large)   Pulse 78   Ht '5\' 7"'$  (1.702 m)   Wt (!) 356 lb (161.5 kg)   SpO2 99%   BMI 55.76 kg/m?  ?BP Readings from Last 3 Encounters:  ?03/31/22 130/71  ?11/23/21 (!) 144/91  ?10/06/21 (!) 151/78  ? ?  ? ?Physical Exam ?Constitutional:   ?   Appearance: Normal appearance. She is obese.  ?HENT:  ?   Head: Normocephalic.  ?Neck:  ?   Vascular: No carotid bruit.  ?Cardiovascular:  ?   Rate and Rhythm: Normal rate and regular rhythm.  ?   Pulses: Normal pulses.  ?   Heart sounds: Normal heart sounds.  ?Pulmonary:  ?   Effort: Pulmonary effort is normal.  ?   Breath sounds: Normal breath sounds.  ?Musculoskeletal:  ?   Cervical back: Normal range of motion and neck supple. No tenderness.  ?Lymphadenopathy:  ?   Cervical: No cervical adenopathy.  ?Neurological:  ?   General: No focal deficit present.  ?   Mental Status: She is alert and oriented to person, place, and time.  ?Psychiatric:     ?   Mood and Affect: Mood normal.  ? ? ? ?Results for orders placed or performed in visit on 03/31/22  ?POCT glycosylated hemoglobin (Hb A1C)  ?Result Value Ref Range  ? Hemoglobin A1C 9.7 (  A) 4.0 - 5.6 %  ? HbA1c POC (<> result, manual entry)    ? HbA1c, POC (prediabetic range)    ? HbA1c, POC (controlled diabetic range)    ? ? ? ?  ?Assessment & Plan:  ?..Cynthia Lee was seen today for follow-up. ? ?Diagnoses and all orders for this visit: ? ?Uncontrolled type 2 diabetes mellitus with hyperglycemia ([facility]) ?-     POCT glycosylated hemoglobin (Hb A1C) ?-     Empagliflozin-metFORMIN HCl ER (SYNJARDY XR) 08-999 MG TB24; Take 1 tablet by mouth daily. ?-     pioglitazone (ACTOS) 45 MG tablet; Take 1 tablet (45 mg total) by mouth daily. ?-     tirzepatide [facility]) 2.5 MG/0.5ML Pen; Inject 2.5 mg into the skin once a week. ?-     tirzepatide (MOUNJARO) 5 MG/0.5ML Pen; Inject 5 mg into the skin once a week. ?-     tirzepatide  (MOUNJARO) 7.5 MG/0.5ML Pen; Inject 7.5 mg into the skin once a week. ? ?Benign essential hypertension ?-     COMPLETE METABOLIC PANEL WITH GFR ?-     olmesartan-hydrochlorothiazide (BENICAR HCT) 40-25 MG tablet; Take 1 tablet by mouth daily. ? ?Yeast infection of the skin ?-     nystatin-triamcinolone (MYCOLOG II) cream; Apply topically 2 (two) times daily. Apply to area ? ?Class 3 severe obesity due to excess calories with serious comorbidity and body mass index (BMI) of 50.0 to 59.9 in adult [facility]) ? ?Dyslipidemia, goal LDL below 70 ? ?Need for Tdap vaccination ?-     Tdap vaccine greater than or equal to 7yo IM ? ? ?A1C not controlled but has not started mounjaro yet ?Will send to [place] to get approved ?If not approved need to start long acting insulin ?Continue synjardy and actos ?BP to goal on ARB ?On STATIN ?Needs eye exam ?Foot exam UTD ?Covid vaccine declined ?Flu/pneumonia vaccine declined ?Tdap given today ?Follow up in 3 months.  ? ?Refills on cream for skin fungal infection as needed given.  ? ? ? ?Return in about 3 months (around 07/01/2022).  ? ? ?Iran Planas, PA-C ? ?

## 2022-04-10 ENCOUNTER — Other Ambulatory Visit: Payer: Self-pay | Admitting: Physician Assistant

## 2022-06-13 ENCOUNTER — Telehealth: Payer: Self-pay

## 2022-06-13 NOTE — Telephone Encounter (Signed)
Patient called requesting a med refill for [facility]. Per patient, should she continue on the 2.5 mg or start 5 mg of the rx? Please advise, thanks.

## 2022-06-14 NOTE — Telephone Encounter (Signed)
Task completed. Patient has been notified of provider's recommendation. Per patient, she is doing well and will continue with Mounjaro rx at 5 mg.

## 2022-07-07 ENCOUNTER — Encounter: Payer: Self-pay | Admitting: Physician Assistant

## 2022-07-07 ENCOUNTER — Ambulatory Visit: Payer: BC Managed Care – PPO | Admitting: Physician Assistant

## 2022-07-07 VITALS — BP 138/80 | HR 79 | Ht 67.0 in | Wt 367.0 lb

## 2022-07-07 DIAGNOSIS — I10 Essential (primary) hypertension: Secondary | ICD-10-CM

## 2022-07-07 DIAGNOSIS — E785 Hyperlipidemia, unspecified: Secondary | ICD-10-CM | POA: Diagnosis not present

## 2022-07-07 DIAGNOSIS — Z6841 Body Mass Index (BMI) 40.0 and over, adult: Secondary | ICD-10-CM

## 2022-07-07 DIAGNOSIS — E1165 Type 2 diabetes mellitus with hyperglycemia: Secondary | ICD-10-CM | POA: Diagnosis not present

## 2022-07-07 LAB — POCT UA - MICROALBUMIN
Albumin/Creatinine Ratio, Urine, POC: <30
Creatinine, POC: 300 mg/dL
Microalbumin Ur, POC: 30 mg/L

## 2022-07-07 LAB — POCT GLYCOSYLATED HEMOGLOBIN (HGB A1C): Hemoglobin A1C: 8.1 % — AB (ref 4.0–5.6)

## 2022-07-07 MED ORDER — SYNJARDY XR 10-1000 MG PO TB24: 1.0000 | ORAL_TABLET | Freq: Every day | ORAL | 0 refills | Status: DC

## 2022-07-07 NOTE — Patient Instructions (Signed)
Increase to mounjaro '5mg'$  Stay on same medication

## 2022-07-07 NOTE — Progress Notes (Signed)
Established Patient Office Visit  Subjective   Patient ID: Cynthia Lee, female    DOB: 09/23/1976  Age: 46 y.o. MRN: [id number]  Chief Complaint  Patient presents with   Diabetes    HPI Pt is a 46 yo obese female with T2DM, HTN, HLD who presents to the clinic for 3 month follow up.   She did start mounjaro 2.'5mg'$  and had benefit in appetite and hunger. She is a few days out but '5mg'$  was on backorder. No major SE. No hypoglycemic events. Denies any CP, palpitations, headaches, vision changes. She is active but not exercising. She is trying to watch her diet. She did just get back from vacation and gained weight from last visit.    Active Ambulatory Problems    Diagnosis Date Noted   History of anemia 10/09/2012   Class 3 severe obesity due to excess calories with serious comorbidity and body mass index (BMI) of 50.0 to 59.9 in adult [facility]) 10/09/2012   Varicose vein of leg 10/09/2012   Abnormal Pap smear, atypical squamous cells of undetermined sign (ASC-US) 10/31/2012   Elevated fasting glucose 11/04/2012   Benign essential hypertension 11/04/2012   Metatarsalgia with corn of right foot 09/16/2014   Morbid obesity ([place]) 03/31/2015   Uncontrolled type 2 diabetes mellitus without complication, without long-term current use of insulin 02/08/2016   Yeast infection of the skin 12/05/2016   Irritable bowel syndrome with diarrhea 12/05/2016   Vitamin D deficiency 05/20/2018   Dyslipidemia, goal LDL below 70 05/20/2018   Perioral dermatitis 05/24/2018   Numbness of left lower extremity 06/25/2018   Uncontrolled type 2 diabetes mellitus with hyperglycemia ([place]) 10/07/2019   Polyp of colon    Resolved Ambulatory Problems    Diagnosis Date Noted   No Resolved Ambulatory Problems   Past Medical History:  Diagnosis Date   Allergy    Anemia    Diabetes mellitus without complication (HCC)    Heart murmur    Hypertension     ROS    Objective:     BP 138/80   Pulse 79   Ht 5'  7" (1.702 m)   Wt (!) 367 lb (166.5 kg)   SpO2 98%   BMI 57.48 kg/m  BP Readings from Last 3 Encounters:  07/07/22 138/80  03/31/22 130/71  11/23/21 (!) 144/91   Wt Readings from Last 3 Encounters:  07/07/22 (!) 367 lb (166.5 kg)  03/31/22 (!) 356 lb (161.5 kg)  11/23/21 (!) 360 lb (163.3 kg)      Physical Exam Constitutional:      Appearance: Normal appearance. She is obese.  HENT:     Head: Normocephalic.  Cardiovascular:     Rate and Rhythm: Normal rate and regular rhythm.     Pulses: Normal pulses.     Heart sounds: Normal heart sounds.  Pulmonary:     Effort: Pulmonary effort is normal.     Breath sounds: Normal breath sounds.  Neurological:     General: No focal deficit present.     Mental Status: She is alert and oriented to person, place, and time.  Psychiatric:        Mood and Affect: Mood normal.      Results for orders placed or performed in visit on 07/07/22  POCT glycosylated hemoglobin (Hb A1C)  Result Value Ref Range   Hemoglobin A1C 8.1 (A) 4.0 - 5.6 %   HbA1c POC (<> result, manual entry)     HbA1c, POC (prediabetic range)  HbA1c, POC (controlled diabetic range)    POCT UA - Microalbumin  Result Value Ref Range   Microalbumin Ur, POC 30 mg/L   Creatinine, POC 300 mg/dL   Albumin/Creatinine Ratio, Urine, POC <30       Assessment & Plan:  [name] KitchenMarland KitchenCariana was seen today for diabetes.  Diagnoses and all orders for this visit:  Uncontrolled type 2 diabetes mellitus with hyperglycemia ([place]) -     POCT glycosylated hemoglobin (Hb A1C) -     POCT UA - Microalbumin -     Empagliflozin-metFORMIN HCl ER (SYNJARDY XR) 08-999 MG TB24; Take 1 tablet by mouth daily. -     Amb Referral to Bariatric Surgery  Class 3 severe obesity due to excess calories with serious comorbidity and body mass index (BMI) of 50.0 to 59.9 in adult [facility]) -     Amb Referral to Bariatric Surgery  Benign essential hypertension -     Amb Referral to Bariatric  Surgery  Dyslipidemia, goal LDL below 70 -     Amb Referral to Bariatric Surgery   A1C improved but not to goal Increased mounjaro to '5mg'$  then 7.'5mg'$  Not lost weight but gained weight Pt would consider gastric sleeve surgery, referral made BP go goal, on Benicar On statin Needs eye exam Normal microalbumin Flu shot next visit Declines pneumonia vaccine Follow up in 3 months   Iran Planas, PA-C

## 2022-07-09 DIAGNOSIS — R040 Epistaxis: Secondary | ICD-10-CM | POA: Diagnosis not present

## 2022-07-09 DIAGNOSIS — I10 Essential (primary) hypertension: Secondary | ICD-10-CM | POA: Diagnosis not present

## 2022-07-10 ENCOUNTER — Telehealth: Payer: Self-pay | Admitting: Neurology

## 2022-07-10 NOTE — Telephone Encounter (Signed)
Patient LVM wanting to follow up with Luvenia Starch from ED visit yesterday. [phone].

## 2022-07-11 ENCOUNTER — Telehealth: Payer: Self-pay

## 2022-07-11 DIAGNOSIS — R040 Epistaxis: Secondary | ICD-10-CM

## 2022-07-11 NOTE — Telephone Encounter (Signed)
Patient called stating she continues to have the nosebleeds. Per patient, it is more noticeable when she is lying down. She feels the dripping. She is wondering if there is anything she could do to reduce the bleeding. Patient wants to know if she needs to follow up with you, even though she was recently seen at Urgent Care. Please advise, thanks.

## 2022-07-12 NOTE — Telephone Encounter (Signed)
Task completed. Patient was informed of provider's recommendation. Patient has scheduled an appointment with provider on 07/17/22 for a follow up.

## 2022-07-12 NOTE — Telephone Encounter (Signed)
Can we call patient to schedule?

## 2022-07-12 NOTE — Telephone Encounter (Signed)
Patient has a HFU scheduled for 07/17/22. AMUCK

## 2022-07-17 ENCOUNTER — Ambulatory Visit: Payer: BC Managed Care – PPO | Admitting: Physician Assistant

## 2022-07-17 ENCOUNTER — Encounter: Payer: Self-pay | Admitting: Physician Assistant

## 2022-07-17 VITALS — BP 142/70 | HR 83 | Ht 67.0 in | Wt 359.0 lb

## 2022-07-17 DIAGNOSIS — R040 Epistaxis: Secondary | ICD-10-CM | POA: Insufficient documentation

## 2022-07-17 NOTE — Patient Instructions (Addendum)
Nasal ayr gel  Nosebleed, Adult A nosebleed is when blood comes out of the nose. Nosebleeds are common and can be caused by many things. They are usually not a sign of a serious medical problem. Follow these instructions at home: When you have a nosebleed:  Sit down. Tilt your head forward a little. Follow these steps: Pinch your nose with a clean towel or tissue. Keep pinching your nose for 5 minutes. Do not let go. After 5 minutes, let go of your nose. Keep doing these steps until the bleeding stops. Do not put tissues or other things in your nose to stop the bleeding. Avoid lying down or putting your head back. Use a nose spray decongestant as told by your doctor. After a nosebleed: Try not to blow your nose or sniffle for several hours. Try not to strain, lift, or bend at the waist for several days. Aspirin and medicines that thin your blood make bleeding more likely. If you take these medicines: Ask your doctor if you should stop taking them or if you should change how much you take. Do not stop taking the medicine unless your doctor tells you to. If your nosebleed was caused by dryness, use over-the-counter saline nasal spray or gel and a humidifier as told by your doctor. This will keep the inside of your nose moist and allow it to heal. If you need to use nasal spray or gel: Choose one that is water-soluble. Use only as much as you need and use it only as often as needed. Do not lie down right away after you use it. If you get nosebleeds often, talk with your doctor about treatments. These may include: Nasal cautery. A chemical swab or electrical device is used to lightly burn tiny blood vessels inside the nose. This helps stop or prevent nosebleeds. Nasal packing. A gauze or other material is placed in the nose to keep constant pressure on the bleeding area. Contact a doctor if: You have a fever. You get nosebleeds often. You get nosebleeds more often than usual. You bruise  very easily. You have something stuck in your nose. You are bleeding in your mouth. You vomit or cough up brown material. You get a nosebleed after you start a new medicine. Get help right away if: You have a nosebleed after you fall or hurt your head. Your nosebleed does not go away after 20 minutes. You feel dizzy or weak. You have unusual bleeding from other parts of your body. You have unusual bruising on other parts of your body. You get sweaty. You vomit blood. Summary Nosebleeds are common. They are usually not a sign of a serious medical problem. When you have a nosebleed, sit down and tilt your head a little forward. Pinch your nose with a clean tissue for 5 minutes. Use saline spray or saline gel and a humidifier as told by your doctor. Get help right away if your nosebleed does not go away after 20 minutes. This information is not intended to replace advice given to you by your health care provider. Make sure you discuss any questions you have with your health care provider. Document Revised: 11/22/2021 Document Reviewed: 11/22/2021 Elsevier Patient Education  [place].

## 2022-07-17 NOTE — Progress Notes (Signed)
   Established Patient Office Visit  Subjective   Patient ID: Cynthia Lee, female    DOB: [birth year]  Age: 46 y.o. MRN: [id number]  No chief complaint on file.   HPI Pt is a 46 yo female who presents to the clinic to follow up on nose bleeds. She has had a few nosebleeds over the last month from her left nare. She had to go to ED on 8/13 because she could not get in stopped after 20 minutes. She was bleeding "clots". They did cauterize nosebleed in ED with silver nitrite stick. Last night she had another nose bleed but after afrin and compression was able to stop bleeding.    .. Active Ambulatory Problems    Diagnosis Date Noted   History of anemia 10/09/2012   Class 3 severe obesity due to excess calories with serious comorbidity and body mass index (BMI) of 50.0 to 59.9 in adult [facility]) 10/09/2012   Varicose vein of leg 10/09/2012   Abnormal Pap smear, atypical squamous cells of undetermined sign (ASC-US) 10/31/2012   Elevated fasting glucose 11/04/2012   Benign essential hypertension 11/04/2012   Metatarsalgia with corn of right foot 09/16/2014   Morbid obesity ([place]) 03/31/2015   Uncontrolled type 2 diabetes mellitus without complication, without long-term current use of insulin 02/08/2016   Yeast infection of the skin 12/05/2016   Irritable bowel syndrome with diarrhea 12/05/2016   Vitamin D deficiency 05/20/2018   Dyslipidemia, goal LDL below 70 05/20/2018   Perioral dermatitis 05/24/2018   Numbness of left lower extremity 06/25/2018   Uncontrolled type 2 diabetes mellitus with hyperglycemia ([place]) 10/07/2019   Polyp of colon    Epistaxis 07/17/2022   Resolved Ambulatory Problems    Diagnosis Date Noted   No Resolved Ambulatory Problems   Past Medical History:  Diagnosis Date   Allergy    Anemia    Diabetes mellitus without complication (HCC)    Heart murmur    Hypertension      ROS See HPI.    Objective:     There were no vitals taken for this visit. BP  Readings from Last 3 Encounters:  07/17/22 (!) 142/70  07/07/22 138/80  03/31/22 130/71   Wt Readings from Last 3 Encounters:  07/17/22 (!) 359 lb (162.8 kg)  07/07/22 (!) 367 lb (166.5 kg)  03/31/22 (!) 356 lb (161.5 kg)      Physical Exam Constitutional:      Appearance: Normal appearance. She is obese.  HENT:     Head: Normocephalic.     Nose:     Comments: Bilateral swollen nasal turbinates with left great than right and appearance of largre scab medial septum of left nare No active bleeding Neurological:     Mental Status: She is alert.         Assessment & Plan:  [name] KitchenMarland KitchenKatreena was seen today for hospitalization follow-up.  Diagnoses and all orders for this visit:  Epistaxis   Referral made to ENT for follow up(number given to call since referral made 8/16) Discussed nasal ayr and humidifier in room at home Continue to use afrin for nosebleed if occurs Stay hydrated Reassured patient about nosebleeds   Iran Planas, PA-C

## 2022-08-27 ENCOUNTER — Other Ambulatory Visit: Payer: Self-pay | Admitting: Physician Assistant

## 2022-08-27 DIAGNOSIS — E1165 Type 2 diabetes mellitus with hyperglycemia: Secondary | ICD-10-CM

## 2022-10-09 ENCOUNTER — Ambulatory Visit: Payer: BC Managed Care – PPO | Admitting: Physician Assistant

## 2022-10-09 DIAGNOSIS — Z862 Personal history of diseases of the blood and blood-forming organs and certain disorders involving the immune mechanism: Secondary | ICD-10-CM

## 2022-10-09 DIAGNOSIS — I10 Essential (primary) hypertension: Secondary | ICD-10-CM

## 2022-10-09 DIAGNOSIS — E1165 Type 2 diabetes mellitus with hyperglycemia: Secondary | ICD-10-CM

## 2022-10-09 DIAGNOSIS — Z1329 Encounter for screening for other suspected endocrine disorder: Secondary | ICD-10-CM

## 2022-10-09 DIAGNOSIS — E785 Hyperlipidemia, unspecified: Secondary | ICD-10-CM

## 2022-10-16 ENCOUNTER — Ambulatory Visit: Payer: BC Managed Care – PPO | Admitting: Physician Assistant

## 2022-10-16 ENCOUNTER — Encounter: Payer: Self-pay | Admitting: Physician Assistant

## 2022-10-16 VITALS — BP 158/91 | HR 60 | Ht 67.0 in | Wt 359.0 lb

## 2022-10-16 DIAGNOSIS — E785 Hyperlipidemia, unspecified: Secondary | ICD-10-CM

## 2022-10-16 DIAGNOSIS — Z1231 Encounter for screening mammogram for malignant neoplasm of breast: Secondary | ICD-10-CM

## 2022-10-16 DIAGNOSIS — B372 Candidiasis of skin and nail: Secondary | ICD-10-CM

## 2022-10-16 DIAGNOSIS — Z6841 Body Mass Index (BMI) 40.0 and over, adult: Secondary | ICD-10-CM

## 2022-10-16 DIAGNOSIS — E66813 Obesity, class 3: Secondary | ICD-10-CM

## 2022-10-16 DIAGNOSIS — E1165 Type 2 diabetes mellitus with hyperglycemia: Secondary | ICD-10-CM | POA: Diagnosis not present

## 2022-10-16 DIAGNOSIS — I10 Essential (primary) hypertension: Secondary | ICD-10-CM | POA: Diagnosis not present

## 2022-10-16 DIAGNOSIS — Z862 Personal history of diseases of the blood and blood-forming organs and certain disorders involving the immune mechanism: Secondary | ICD-10-CM | POA: Diagnosis not present

## 2022-10-16 DIAGNOSIS — Z1329 Encounter for screening for other suspected endocrine disorder: Secondary | ICD-10-CM

## 2022-10-16 LAB — POCT GLYCOSYLATED HEMOGLOBIN (HGB A1C): Hemoglobin A1C: 9.6 % — AB (ref 4.0–5.6)

## 2022-10-16 MED ORDER — ATORVASTATIN CALCIUM 80 MG PO TABS: 80.0000 mg | ORAL_TABLET | Freq: Every day | ORAL | 3 refills | Status: DC

## 2022-10-16 MED ORDER — TIRZEPATIDE 10 MG/0.5ML ~~LOC~~ SOAJ: 10.0000 mg | SUBCUTANEOUS | 0 refills | Status: DC

## 2022-10-16 MED ORDER — NYSTATIN-TRIAMCINOLONE 100000-0.1 UNIT/GM-% EX CREA: TOPICAL_CREAM | Freq: Two times a day (BID) | CUTANEOUS | 1 refills | Status: DC

## 2022-10-16 MED ORDER — AMLODIPINE BESYLATE 2.5 MG PO TABS: 2.5000 mg | ORAL_TABLET | Freq: Every day | ORAL | 0 refills | Status: DC

## 2022-10-16 MED ORDER — PIOGLITAZONE HCL 45 MG PO TABS: 45.0000 mg | ORAL_TABLET | Freq: Every day | ORAL | 0 refills | Status: DC

## 2022-10-16 MED ORDER — SYNJARDY XR 10-1000 MG PO TB24: 1.0000 | ORAL_TABLET | Freq: Every day | ORAL | 0 refills | Status: DC

## 2022-10-16 NOTE — Progress Notes (Signed)
Established Patient Office Visit  Subjective   Patient ID: Cynthia Lee, female    DOB: [birth year]  Age: 46 y.o. MRN: [id number]  Chief Complaint  Patient presents with   Follow-up    HPI Pt is a 46 yo obese female with T2DM, IDA, HTN, HLD who presents to the clinic for 3 month follow up.   Pt is not checking her sugars. She is fairly compliant with medications. She has missed a some medications when she travels out of town. No open wounds or sores. No hypoglycemic events. She is trying to stay active. She has made lots of diet changes to try to be healthier and get sugars down. No CP, palpitations, headaches, vision changes. No hypoglycemic events.    Active Ambulatory Problems    Diagnosis Date Noted   History of anemia 10/09/2012   Class 3 severe obesity due to excess calories with serious comorbidity and body mass index (BMI) of 50.0 to 59.9 in adult [facility]) 10/09/2012   Varicose vein of leg 10/09/2012   Abnormal Pap smear, atypical squamous cells of undetermined sign (ASC-US) 10/31/2012   Elevated fasting glucose 11/04/2012   Benign essential hypertension 11/04/2012   Metatarsalgia with corn of right foot 09/16/2014   Morbid obesity ([place]) 03/31/2015   Uncontrolled type 2 diabetes mellitus without complication, without long-term current use of insulin 02/08/2016   Yeast infection of the skin 12/05/2016   Irritable bowel syndrome with diarrhea 12/05/2016   Vitamin D deficiency 05/20/2018   Dyslipidemia, goal LDL below 70 05/20/2018   Perioral dermatitis 05/24/2018   Numbness of left lower extremity 06/25/2018   Uncontrolled type 2 diabetes mellitus with hyperglycemia ([place]) 10/07/2019   Polyp of colon    Epistaxis 07/17/2022   Resolved Ambulatory Problems    Diagnosis Date Noted   No Resolved Ambulatory Problems   Past Medical History:  Diagnosis Date   Allergy    Anemia    Diabetes mellitus without complication (HCC)    Heart murmur    Hypertension      ROS See  HPI.    Objective:     BP (!) 158/91   Pulse 60   Ht '5\' 7"'$  (1.702 m)   Wt (!) 359 lb (162.8 kg)   SpO2 96%   BMI 56.23 kg/m  BP Readings from Last 3 Encounters:  10/16/22 (!) 158/91  07/17/22 (!) 142/70  07/07/22 138/80   Wt Readings from Last 3 Encounters:  10/16/22 (!) 359 lb (162.8 kg)  07/17/22 (!) 359 lb (162.8 kg)  07/07/22 (!) 367 lb (166.5 kg)      Physical Exam Constitutional:      Appearance: Normal appearance. She is obese.  HENT:     Head: Normocephalic.  Cardiovascular:     Rate and Rhythm: Normal rate and regular rhythm.     Pulses: Normal pulses.  Pulmonary:     Effort: Pulmonary effort is normal.     Breath sounds: Normal breath sounds.  Musculoskeletal:     Right lower leg: Edema present.     Left lower leg: Edema present.  Neurological:     Mental Status: She is alert.  Psychiatric:        Mood and Affect: Mood normal.      Results for orders placed or performed in visit on 10/16/22  POCT glycosylated hemoglobin (Hb A1C)  Result Value Ref Range   Hemoglobin A1C 9.6 (A) 4.0 - 5.6 %   HbA1c POC (<> result, manual entry)  HbA1c, POC (prediabetic range)     HbA1c, POC (controlled diabetic range)         Assessment & Plan:  [name] KitchenMarland KitchenSamya was seen today for follow-up.  Diagnoses and all orders for this visit:  Uncontrolled type 2 diabetes mellitus with hyperglycemia (HCC) -     COMPLETE METABOLIC PANEL WITH GFR -     POCT glycosylated hemoglobin (Hb A1C) -     Empagliflozin-metFORMIN HCl ER (SYNJARDY XR) 08-999 MG TB24; Take 1 tablet by mouth daily. -     pioglitazone (ACTOS) 45 MG tablet; Take 1 tablet (45 mg total) by mouth daily. -     tirzepatide (MOUNJARO) 10 MG/0.5ML Pen; Inject 10 mg into the skin once a week.  Benign essential hypertension -     COMPLETE METABOLIC PANEL WITH GFR -     amLODipine (NORVASC) 2.5 MG tablet; Take 1 tablet (2.5 mg total) by mouth daily.  Dyslipidemia, goal LDL below 70 -     Lipid Panel w/reflex  Direct LDL -     atorvastatin (LIPITOR) 80 MG tablet; Take 1 tablet (80 mg total) by mouth daily.  History of anemia -     CBC with Differential/Platelet  Thyroid disorder screen -     TSH  Yeast infection of the skin -     nystatin-triamcinolone (MYCOLOG II) cream; Apply topically 2 (two) times daily. Apply to area  Encounter for screening mammogram for malignant neoplasm of breast -     MM 3D SCREEN BREAST BILATERAL  Class 3 severe obesity due to excess calories with serious comorbidity and body mass index (BMI) of 50.0 to 59.9 in adult (HCC)   A1C went up by 1 percent to 9.6 Discuss concern that maybe something in her diet She did miss a few mounjaro injections  Increased mounjaro '10mg'$  weekly Continue synjardy/actos BP not to goal added norvasc 2.'5mg'$   Continue on benicar/HcT Follow up in 1 month  Needs fasting labs Mammogram ordered   Iran Planas, PA-C

## 2022-10-16 NOTE — Progress Notes (Signed)
Walking stairs Saturday night, moved and felt a pop in right hip,  Painful but slowly getting better  Swelling in right ankle and bruising when she wears socks  Started Mounjaro at 7.5 mg because pharmacy didn't have 5 mg, felt bad so skipped a week A1c 9.6  BP elevated 158/91

## 2022-10-16 NOTE — Patient Instructions (Signed)

## 2022-10-18 ENCOUNTER — Encounter: Payer: Self-pay | Admitting: Physician Assistant

## 2022-11-15 ENCOUNTER — Ambulatory Visit (INDEPENDENT_AMBULATORY_CARE_PROVIDER_SITE_OTHER): Payer: BC Managed Care – PPO

## 2022-11-15 DIAGNOSIS — Z1231 Encounter for screening mammogram for malignant neoplasm of breast: Secondary | ICD-10-CM

## 2022-11-16 NOTE — Progress Notes (Signed)
Normal mammogram. Follow up in 1 year.

## 2022-12-26 ENCOUNTER — Ambulatory Visit: Payer: Self-pay | Admitting: Licensed Clinical Social Worker

## 2022-12-26 NOTE — Patient Instructions (Signed)
    It was a pleasure speaking with you today. Per your request a Care Coordination phone appointment is scheduled 01/02/23 to provider more information about the care coordination program.  Casimer Lanius, [place] [phone]

## 2022-12-26 NOTE — Patient Outreach (Signed)
  Care Coordination  Initial Visit Note   12/26/2022 Name: Paraskevi Funez MRN: [id number] DOB: 03/11/1976  Shaya Reddick is a 47 y.o. year old female who sees [place], Royetta Car, [place] for primary care. I spoke with  [name] by phone today.  What matters to the patients health and wellness today?   Patient scheduled phone appointment to obtain additional information about the Care Coordination program..    SDOH assessments and interventions completed:  No   Care Coordination Interventions:  No, not indicated   Follow up plan: Follow up call scheduled for 01/02/23    Encounter Outcome:  Pt. Visit Completed   Casimer Lanius, [place] [phone]

## 2023-01-02 ENCOUNTER — Ambulatory Visit: Payer: Self-pay | Admitting: Licensed Clinical Social Worker

## 2023-01-02 NOTE — Patient Outreach (Signed)
  Care Coordination  Initial Visit Note   01/02/2023 Name: Cynthia Lee MRN: [id number] DOB: [birth year]  Cynthia Lee is a 47 y.o. year old female who sees [place], Royetta Car, [place] for primary care. I spoke with  Cynthia Lee by phone today.  What matters to the patients health and wellness today?    Assessed patient's needs, support system and barriers to care.  Patient is experiencing difficulty with managing her diabetes. She also reports stress at home, however unable to go to therapy due to co-pay.  Recommendation: Patient may benefit from, and is in agreement to  work with LCSW to manage stressors and  work with Consulting civil engineer on diabetes   Goals Addressed             This Visit's Progress    Reduce stress with mental health support       Activities in order to accomplish goals.   Continue with compliance of taking medication prescribed by Doctor I am looking forward to working with you on your goal         SDOH assessments and interventions completed:  Yes  SDOH Interventions Today    Flowsheet Row Most Recent Value  SDOH Interventions   Food Insecurity Interventions Intervention Not Indicated  Housing Interventions Intervention Not Indicated  Transportation Interventions Intervention Not Indicated  Utilities Interventions Intervention Not Indicated  Stress Interventions Other (Comment)       Care Coordination Interventions:  Yes, provided  Interventions Today    Flowsheet Row Most Recent Value  Chronic Disease Discussed/Reviewed   Chronic disease discussed/reviewed during today's visit Diabetes  General Interventions   General Interventions Discussed/Reviewed General Interventions Discussed, Referral to Nurse  Education Interventions   Education Provided Provided Verbal Education  Provided Verbal Education On Mental Health/Coping with Illness       Follow up plan:  Referral made to RN Care Manager Follow up call scheduled for 01/09/23 with LCSW     Encounter Outcome:  Pt. Visit Completed   Casimer Lanius, [place] [phone]

## 2023-01-02 NOTE — Patient Instructions (Signed)
Visit Information  Thank you for taking time to visit with me today. Please don't hesitate to contact me if I can be of assistance to you.   Following are the goals we discussed today:   Goals Addressed             This Visit's Progress    Reduce stress with mental health support       Activities in order to accomplish goals.   Continue with compliance of taking medication prescribed by Doctor I am looking forward to working with you on your goal         Our next appointment is by telephone on 01/09/23 at 1:15  Please call the care guide team at [phone] if you need to cancel or reschedule your appointment.    Patient verbalizes understanding of instructions and care plan provided today and agrees to view in [place]. Active MyChart status and patient understanding of how to access instructions and care plan via MyChart confirmed with patient.     Casimer Lanius, [place] [phone]

## 2023-01-09 ENCOUNTER — Telehealth: Payer: Self-pay | Admitting: Licensed Clinical Social Worker

## 2023-01-09 ENCOUNTER — Encounter: Payer: Self-pay | Admitting: Licensed Clinical Social Worker

## 2023-01-09 NOTE — Patient Instructions (Signed)
    I am sorry you were unable to keep your phone appointment today.   The Care Guide will contact you to reschedule the phone appointment    Chriss Redel, LCSW Social Work Care Coordination  [place] [facility] [phone]  

## 2023-01-09 NOTE — Patient Outreach (Signed)
  Care Coordination   01/09/2023 Name: Naria Abbey MRN: [id number] DOB: January 23, 1976   Care Coordination Outreach Attempts:  incoming voice message from patient.  She is unable to keep Phone appointment today. Follow Up Plan:  She will call back to reschedule.  Will route chart to care guide to reschedule.   Encounter Outcome:  Pt. Request to Call Back   Care Coordination Interventions:  No, not indicated    Casimer Lanius, [place] [phone]

## 2023-01-15 ENCOUNTER — Ambulatory Visit: Payer: Self-pay | Admitting: Licensed Clinical Social Worker

## 2023-01-15 ENCOUNTER — Ambulatory Visit: Payer: Self-pay

## 2023-01-15 NOTE — Patient Instructions (Signed)
Visit Information  Thank you for taking time to visit with me today. Please don't hesitate to contact me if I can be of assistance to you.   Following are the goals we discussed today:   Goals Addressed             This Visit's Progress    Diabetes       Interventions Today    Flowsheet Row Most Recent Value  Chronic Disease   Chronic disease during today's visit Diabetes  General Interventions   General Interventions Discussed/Reviewed General Interventions Discussed, Doctor Visits  [upcoming appointments reviewed, RNCM provided contact number and encouraged to call with care coordination needs]  Doctor Visits Discussed/Reviewed Doctor Visits Discussed  [appointment tomorrow with PCP]  Exercise Interventions   Exercise Discussed/Reviewed Physical Activity  Physical Activity Discussed/Reviewed Physical Activity Discussed  Education Interventions   Education Provided Provided Web-based Education  [diabetes: nutrition and healthy eating,  diabetes type 2]           Our next appointment is by telephone on 01/28/33 at 2:30 pm  Please call the care guide team at [phone] if you need to cancel or reschedule your appointment.   If you are experiencing a Mental Health or [place] or need someone to talk to, please call the Suicide and Crisis Lifeline: [place], RN, MSN, BSN, [place] [phone]

## 2023-01-15 NOTE — Patient Outreach (Signed)
  Care Coordination   Initial Visit Note   01/15/2023 Name: Cynthia Lee MRN: [id number] DOB: 15-Feb-1976  Cynthia Lee is a 46 y.o. year old female who sees [place], Royetta Car, [place] for primary care. I spoke with  [name] by phone today.  What matters to the patients health and wellness today?  Ms. Courtemanche reports diabetes approximate 2 years. Follow up appointment with PCP tomorrow. Patient reports she will discuss medications with primary care tomorrow. She reports she did not check blood sugar today, but yesterday states it was 124.  Goals Addressed             This Visit's Progress    Diabetes       Interventions Today    Flowsheet Row Most Recent Value  Chronic Disease   Chronic disease during today's visit Diabetes  General Interventions   General Interventions Discussed/Reviewed General Interventions Discussed  Exercise Interventions   Exercise Discussed/Reviewed Physical Activity  Physical Activity Discussed/Reviewed Physical Activity Discussed           SDOH assessments and interventions completed:  No recently reviewed. No changes.  Care Coordination Interventions:  Yes, provided   Follow up plan: Follow up call scheduled for 01/29/23    Encounter Outcome:  Pt. Visit Completed   Thea Silversmith, RN, MSN, BSN, [place] Coordinator [phone]

## 2023-01-16 ENCOUNTER — Ambulatory Visit: Payer: BC Managed Care – PPO | Admitting: Physician Assistant

## 2023-01-16 DIAGNOSIS — E1165 Type 2 diabetes mellitus with hyperglycemia: Secondary | ICD-10-CM

## 2023-01-16 NOTE — Patient Outreach (Signed)
  Care Coordination  Follow Up Visit Note   01/16/2023 Name: Cynthia Lee MRN: [id number] DOB: 09/25/1976  Cynthia Lee is a 47 y.o. year old female who sees Cynthia Lee, Cynthia Lee, [place] for primary care. I spoke with  Cynthia Lee by phone today.  What matters to the patients health and wellness today?    Patient is experiencing symptoms of  stress which seems to be exacerbated by stress at home. She is making progress with being able to process her thoughts, and options during this encounter.    Goals Addressed             This Visit's Progress    Reduce stress with mental health support       Activities in order to accomplish goals.   Continue with compliance of taking medication prescribed by Doctor Review your EMMI educational information (Breathing to Relax, and relieving stresss) Look for an e-mail from [facility] Think about our conversation and implement when appropriate when talking with your daughter          SDOH assessments and interventions completed:  No    Care Coordination Interventions:  Yes, provided  Interventions Today    Flowsheet Row Most Recent Value  Chronic Disease   Chronic disease during today's visit Hypertension (HTN), Diabetes  Education Interventions   Education Provided Provided Education, Provided Web-based Education  [facility] education]  Provided Verbal Education On Other  [managing stress at Cynthia Lee Discussed/Reviewed Mental Health Discussed, Coping Strategies       Follow up plan: Follow up call scheduled for 01/30/2023    Encounter Outcome:  Pt. Visit Completed   Cynthia Lee, [place] [phone]

## 2023-01-16 NOTE — Patient Instructions (Signed)
Visit Information  Thank you for taking time to visit with me today. Please don't hesitate to contact me if I can be of assistance to you.   Following are the goals we discussed today:   Goals Addressed             This Visit's Progress    Reduce stress with mental health support       Activities in order to accomplish goals.   Continue with compliance of taking medication prescribed by Doctor Review your EMMI educational information (Breathing to Relax, and relieving stresss) Look for an e-mail from [facility]. Think about our conversation and implement when appropriate when talking with your daughter          Our next appointment is by telephone on 01/30/23 at 1:15  Please call the care guide team at [phone] if you need to cancel or reschedule your appointment.   If you are experiencing a Mental Health or [place] or need someone to talk to, please call 1-800-273-TALK (toll free, 24 hour hotline)   Patient verbalizes understanding of instructions and care plan provided today and agrees to view in [place]. Active MyChart status and patient understanding of how to access instructions and care plan via MyChart confirmed with patient.     Casimer Lanius, [place] [phone]

## 2023-01-21 ENCOUNTER — Other Ambulatory Visit: Payer: Self-pay | Admitting: Physician Assistant

## 2023-01-21 DIAGNOSIS — I10 Essential (primary) hypertension: Secondary | ICD-10-CM

## 2023-01-29 ENCOUNTER — Ambulatory Visit: Payer: Self-pay

## 2023-01-29 DIAGNOSIS — E1165 Type 2 diabetes mellitus with hyperglycemia: Secondary | ICD-10-CM

## 2023-01-29 NOTE — Patient Outreach (Unsigned)
  Care Coordination   Follow Up Visit Note   01/29/2023 Name: Cynthia Lee MRN: [id number] DOB: 04/20/1976  Cynthia Lee is a 47 y.o. year old female who sees Cynthia Lee, Cynthia Lee, Cynthia Lee for primary care. I spoke with  Cynthia Lee by phone today. Ms. What matters to the patients health and wellness today?  Cynthia Lee reports she is doing well she states she needs to reschedule follow up appointment with PCP. She reports blood sugar has been 139-145 before meals. She states she has been out of Synjardy for about 2 weeks, but is going to pick up a refill up today.  States she would like referral to clinical pharmacist to see if there is any assistance available for the cost of Synjardy.  Goals Addressed             This Visit's Progress    Diabetes       Interventions Today    Flowsheet Row Most Recent Value  Chronic Disease   Chronic disease during today's visit Diabetes  General Interventions   General Interventions Discussed/Reviewed General Interventions Reviewed  Doctor Visits Discussed/Reviewed Doctor Visits Discussed, PCP  PCP/Specialist Visits Compliance with follow-up visit  [Patient to call to reschedule follow up appointment today]  Education Interventions   Education Provided Provided Web-based Education  [confirmed patient has received web based Emmi education. she reports she has not reviewed any yet]  Provided Verbal Education On Blood Sugar Monitoring  [discussed Target range 80-130 before meal and <180 about 2 hours after meals or per provider recommendation]  Pharmacy Interventions   Pharmacy Dicussed/Reviewed Pharmacy Topics Discussed, Medication Adherence, Affording Medications, Referral to Pharmacist  Referral to Pharmacist Cannot afford medications  [patient difficulty to afford cost of medication Synjardy]           SDOH assessments and interventions completed:  No{THN Tip this will not be part of the note when signed-REQUIRED REPORT FIELD DO NOT DELETE  (Optional):27901}  Care Coordination Interventions:  Yes, provided {THN Tip this will not be part of the note when signed-REQUIRED REPORT FIELD DO NOT DELETE (Optional):27901}  Follow up plan: Follow up call scheduled for 03/01/23    Encounter Outcome:  Pt. Visit Completed {THN Tip this will not be part of the note when signed-REQUIRED REPORT FIELD DO NOT DELETE (Optional):27901}  Thea Silversmith, RN, MSN, BSN, [place] Coordinator [phone]

## 2023-01-29 NOTE — Patient Instructions (Signed)
Visit Information  Thank you for taking time to visit with me today. Please don't hesitate to contact me if I can be of assistance to you.   Following are the goals we discussed today:   Goals Addressed             This Visit's Progress    Diabetes       Interventions Today    Flowsheet Row Most Recent Value  Chronic Disease   Chronic disease during today's visit Diabetes  General Interventions   General Interventions Discussed/Reviewed General Interventions Reviewed  Doctor Visits Discussed/Reviewed Doctor Visits Discussed, PCP  PCP/Specialist Visits Compliance with follow-up visit  [Patient to call to reschedule follow up appointment today]  Education Interventions   Education Provided Provided Web-based Education  [confirmed patient has received web based Emmi education. she reports she has not reviewed any yet]  Provided Verbal Education On Blood Sugar Monitoring  [discussed Target range 80-130 before meal and <180 about 2 hours after meals or per provider recommendation]  Pharmacy Interventions   Pharmacy Dicussed/Reviewed Pharmacy Topics Discussed, Medication Adherence, Affording Medications, Referral to Pharmacist  Referral to Pharmacist Cannot afford medications  [patient difficulty to afford cost of medication Synjardy]           Our next appointment is by telephone on 03/01/23 at 2:30 pm  Please call the care guide team at [phone] if you need to cancel or reschedule your appointment.   If you are experiencing a Mental Health or [place] or need someone to talk to, please call the Suicide and Crisis Lifeline: [place], RN, MSN, BSN, [place] [phone]

## 2023-01-30 ENCOUNTER — Ambulatory Visit: Payer: Self-pay | Admitting: Licensed Clinical Social Worker

## 2023-01-30 NOTE — Patient Instructions (Signed)
Visit Information  Thank you for taking time to visit with me today. Please don't hesitate to contact me if I can be of assistance to you.   Following are the goals we discussed today:   Goals Addressed             This Visit's Progress    Reduce stress with mental health support       Activities in order to accomplish goals.   Continue with compliance of taking medication prescribed by Doctor Self Support options  (scheduling an appointment with yourself for self-care) Review your EMMI educational information (Breathing to Relax, and relieving stresss) Look for an e-mail from [facility].         Our next appointment is by telephone on 02/20/23 at 1:15  Please call the care guide team at [phone] if you need to cancel or reschedule your appointment.   If you are experiencing a Mental Health or [place] or need someone to talk to, please call 1-800-273-TALK (toll free, 24 hour hotline)   Patient verbalizes understanding of instructions and care plan provided today and agrees to view in [place]. Active MyChart status and patient understanding of how to access instructions and care plan via MyChart confirmed with patient.     Casimer Lanius, [place] [phone]

## 2023-01-30 NOTE — Patient Outreach (Signed)
  Care Coordination  Follow Up Visit Note   01/30/2023 Name: Cynthia Lee MRN: [id number] DOB: [birth year]  Cynthia Lee is a 47 y.o. year old female who sees [place], Royetta Car, [place] for primary care. I spoke with  Cynthia Lee by phone today.  What matters to the patients health and wellness today?  Reducing stress and managing health     Goals Addressed             This Visit's Progress    Reduce stress with mental health support       Activities in order to accomplish goals.   Continue with compliance of taking medication prescribed by Doctor Self Support options  (scheduling an appointment with yourself for self-care) Review your EMMI educational information (Breathing to Relax, and relieving stresss) Look for an e-mail from [facility].        SDOH assessments and interventions completed:  No  Care Coordination Interventions:  Yes, provided  Interventions Today    Flowsheet Row Most Recent Value  Chronic Disease   Chronic disease during today's visit Hypertension (HTN), Diabetes  Mental Health Interventions   Mental Health Discussed/Reviewed Mental Health Reviewed, Coping Strategies, Other  [solution focused & task centered interventions]       Follow up plan: Follow up call scheduled for 02/20/23 3 weeks    Encounter Outcome:  Pt. Visit Completed   Casimer Lanius, [place] [phone]

## 2023-01-31 ENCOUNTER — Telehealth: Payer: Self-pay

## 2023-01-31 NOTE — Progress Notes (Signed)
   Care Guide Note  01/31/2023 Name: Cynthia Lee MRN: [id number] DOB: January 19, 1976  Referred by: Donella Stade, PA-C Reason for referral : Care Coordination (Outreach to schedule with pharm d )   Cynthia Lee is a 47 y.o. year old female who is a primary care patient of Donella Stade, PA-C. Cynthia Lee was referred to the pharmacist for assistance related to DM.    Successful contact was made with the patient to discuss pharmacy services including being ready for the pharmacist to call at least 5 minutes before the scheduled appointment time, to have medication bottles and any blood sugar or blood pressure readings ready for review. The patient agreed to meet with the pharmacist via with the pharmacist via telephone visit on (date/time).  02/02/2023   Cynthia Lee, [place], [place] [address] Direct Dial: [phone] [email or web address]

## 2023-02-02 ENCOUNTER — Other Ambulatory Visit: Payer: BC Managed Care – PPO | Admitting: Pharmacist

## 2023-02-02 NOTE — Progress Notes (Signed)
02/02/2023 Name: Cynthia Lee MRN: [id number] DOB: [birth year]  Chief Complaint  Patient presents with   Medication Assistance    Cynthia Lee is a 47 y.o. year old female who presented for a telephone visit.   They were referred to the pharmacist by their Case Management Team  for assistance in managing medication access.    Subjective:  Care Team: Primary Care Provider: Lavada Mesi ; Next Scheduled Visit: 02/05/23    Medication Access/Adherence  Current Pharmacy:  [id number] [address], [place]. AT [address] [place] [address] Phone: [phone] Fax: [phone]  [id number] [id number]: [phone]: [phone] [facility] [address] [id number] [place] [id number]: [phone]: [phone]  Patient reports affordability concerns with their medications: No  Patient reports access/transportation concerns to their pharmacy: No  Patient reports adherence concerns with their medications:  No     Diabetes:  Current medications: synjardy XR 10-'1000mg'$  daily, pioglitazone '45mg'$  daily, mounjaro '5mg'$  once weekly (RX for '10mg'$ , patient stated her stomach cannot tolerate at '10mg'$  dosing).  Current glucose readings: spot checks occasionally, cannot recall numbers  Patient was brought into pharmacist care for seeking assistance with Synjardy XR cost. However, call was very brief with loud background noise - patient states she has found a coupon card online for $10 and it worked successfully at her pharmacy.    Objective:  Lab Results  Component Value Date   HGBA1C 9.6 (A) 10/16/2022    Lab Results  Component Value Date    CREATININE 0.80 11/23/2021   BUN 15 11/23/2021   NA 138 11/23/2021   K 4.6 11/23/2021   CL 96 (L) 11/23/2021   CO2 28 11/23/2021    Lab Results  Component Value Date   CHOL 177 11/23/2021   HDL 43 (L) 11/23/2021   LDLCALC 104 (H) 11/23/2021   TRIG 181 (H) 11/23/2021   CHOLHDL 4.1 11/23/2021    Medications Reviewed Today     Reviewed by KDarius Bump RAurelia(Pharmacist) on 02/02/23 at 1354  Med List Status: <None>   Medication Order Taking? Sig Documenting Provider Last Dose Status Informant  AMBULATORY NON FORMULARY MEDICATION [id number] Glucometer lancets and test strips to check sugars once or twice a day for Diabetic control. BDonella Stade PA-C Taking Active Self  amLODipine (NORVASC) 2.5 MG tablet [id number] Take 1 tablet (2.5 mg total) by mouth daily. WILL NEED APPOINTMENT AFTER REFILL BDonella Stade PA-C  Active   atorvastatin (LIPITOR) 80 MG tablet [id number] Take 1 tablet (80 mg total) by mouth daily. BDonella Stade PA-C Taking Active   Empagliflozin-metFORMIN HCl ER (SYNJARDY XR) 08-999 MG TB24 [id number] Take 1 tablet by mouth daily.  Patient not taking: Reported on 01/29/2023   BLavada MesiNot Taking Active            Med Note (Anselm LisMar 4, 2024  2:47 PM) Per patient, picking up prescription today  ferrous sulfate 325 (65 FE) MG tablet [id number] Take 1 tablet (325 mg total) by mouth daily with breakfast.  Patient taking differently: Take  325 mg by mouth 2 (two) times a week.   Donella Stade, PA-C Taking Active   Multiple Vitamins-Minerals (MULTIVITAMIN PO) [id number] No Take by mouth. [provider] Taking Active Self  nystatin-triamcinolone (MYCOLOG II) cream [id number] No Apply topically 2 (two) times daily. Apply to area [place]bler, Royetta Car, PA-C Taking Active   olmesartan-hydrochlorothiazide (BENICAR HCT) 40-25 MG tablet [id number] No Take 1 tablet by mouth daily. Donella Stade, PA-C Taking Active   pioglitazone  (ACTOS) 45 MG tablet [id number] No Take 1 tablet (45 mg total) by mouth daily. Donella Stade, PA-C Taking Active   tirzepatide [facility]) 10 MG/0.5ML Pen [id number] No Inject 10 mg into the skin once a week. Donella Stade, PA-C Taking Active   triamcinolone cream (KENALOG) 0.1 % [id number] No APPLY TO AFFECTED AREA TWICE A DAY Breeback, Jade L, PA-C Taking Active   VITAMIN D PO [id number] No Take 1 tablet by mouth daily. [provider] Taking Active Self              Assessment/Plan:   Diabetes: - Currently unknown control, but pending PCP visit to assess A1c  - Patient was brought into pharmacist care for seeking assistance with Synjardy XR cost. However, call was very brief with loud background noise - patient states she has found a coupon card online for $10 and it worked successfully at her pharmacy. - Recommend to continue current medications     Follow Up Plan: 1 month to assess glucose control, more detailed call about medications   Larinda Buttery, PharmD Clinical Pharmacist [facility] [phone]

## 2023-02-05 ENCOUNTER — Encounter: Payer: Self-pay | Admitting: Physician Assistant

## 2023-02-05 ENCOUNTER — Ambulatory Visit: Payer: BC Managed Care – PPO | Admitting: Physician Assistant

## 2023-02-05 VITALS — BP 107/57 | HR 86 | Ht 67.0 in | Wt 364.0 lb

## 2023-02-05 DIAGNOSIS — I10 Essential (primary) hypertension: Secondary | ICD-10-CM

## 2023-02-05 DIAGNOSIS — E785 Hyperlipidemia, unspecified: Secondary | ICD-10-CM

## 2023-02-05 DIAGNOSIS — Z111 Encounter for screening for respiratory tuberculosis: Secondary | ICD-10-CM

## 2023-02-05 DIAGNOSIS — Z0184 Encounter for antibody response examination: Secondary | ICD-10-CM

## 2023-02-05 DIAGNOSIS — E1165 Type 2 diabetes mellitus with hyperglycemia: Secondary | ICD-10-CM | POA: Diagnosis not present

## 2023-02-05 LAB — POCT GLYCOSYLATED HEMOGLOBIN (HGB A1C): Hemoglobin A1C: 9.8 % — AB (ref 4.0–5.6)

## 2023-02-05 MED ORDER — OLMESARTAN MEDOXOMIL-HCTZ 40-25 MG PO TABS: 1.0000 | ORAL_TABLET | Freq: Every day | ORAL | 2 refills | Status: DC

## 2023-02-05 MED ORDER — TIRZEPATIDE 5 MG/0.5ML ~~LOC~~ SOAJ: 5.0000 mg | SUBCUTANEOUS | 0 refills | Status: DC

## 2023-02-05 MED ORDER — PIOGLITAZONE HCL 45 MG PO TABS: 45.0000 mg | ORAL_TABLET | Freq: Every day | ORAL | 0 refills | Status: DC

## 2023-02-05 MED ORDER — SYNJARDY XR 10-1000 MG PO TB24: 1.0000 | ORAL_TABLET | Freq: Every day | ORAL | 0 refills | Status: DC

## 2023-02-05 NOTE — Progress Notes (Signed)
   Established Patient Office Visit  Subjective   Patient ID: Cynthia Lee, female    DOB: 1975-12-08  Age: 47 y.o. MRN: [id number]  Chief Complaint  Patient presents with  . Follow-up  . Diabetes    HPI  ROS    Objective:     BP (!) 107/57   Pulse 86   Ht 5\' 7"  (1.702 m)   Wt (!) 364 lb (165.1 kg)   SpO2 99%   BMI 57.01 kg/m  BP Readings from Last 3 Encounters:  02/05/23 (!) 107/57  10/16/22 (!) 158/91  07/17/22 (!) 142/70   Wt Readings from Last 3 Encounters:  02/05/23 (!) 364 lb (165.1 kg)  10/16/22 (!) 359 lb (162.8 kg)  07/17/22 (!) 359 lb (162.8 kg)      Physical Exam   Results for orders placed or performed in visit on 02/05/23  POCT glycosylated hemoglobin (Hb A1C)  Result Value Ref Range   Hemoglobin A1C 9.8 (A) 4.0 - 5.6 %   HbA1c POC (<> result, manual entry)     HbA1c, POC (prediabetic range)     HbA1c, POC (controlled diabetic range)       The 10-year ASCVD risk score (Arnett DK, et al., 2019) is: 2.2%    Assessment & Plan:  [name] KitchenMarland KitchenKeeghan was seen today for follow-up and diabetes.  Diagnoses and all orders for this visit:  Uncontrolled type 2 diabetes mellitus with hyperglycemia ([place]) -     POCT glycosylated hemoglobin (Hb A1C)  Immunity status testing     Iran Planas, PA-C

## 2023-02-07 ENCOUNTER — Ambulatory Visit: Payer: BC Managed Care – PPO

## 2023-02-08 ENCOUNTER — Ambulatory Visit (INDEPENDENT_AMBULATORY_CARE_PROVIDER_SITE_OTHER): Payer: BC Managed Care – PPO | Admitting: Family Medicine

## 2023-02-08 DIAGNOSIS — Z111 Encounter for screening for respiratory tuberculosis: Secondary | ICD-10-CM

## 2023-02-08 LAB — TB SKIN TEST
Induration: 0 mm
TB Skin Test: NEGATIVE

## 2023-02-08 NOTE — Progress Notes (Signed)
Patient is here for PPD read.   Results were Negative 0 mm.    Forms completed and given to patient.

## 2023-02-09 NOTE — Progress Notes (Signed)
Agree with documentation as above.   Fabrizzio Marcella, MD  

## 2023-02-09 NOTE — Progress Notes (Signed)
Negative. Paperwork given to patient yesterday correct?

## 2023-02-20 ENCOUNTER — Ambulatory Visit: Payer: Self-pay | Admitting: Licensed Clinical Social Worker

## 2023-02-20 NOTE — Patient Outreach (Signed)
  Care Coordination  Follow Up Visit Note   02/20/2023 Name: Cynthia Lee MRN: [id number] DOB: Nov 01, 1976  Cynthia Lee is a 47 y.o. year old female who sees [place], Royetta Car, [place] for primary care. I spoke with  [name] by phone today.  What matters to the patients health and wellness today?  Patient unable    SDOH assessments and interventions completed:  No  Care Coordination Interventions:  No, not indicated   Follow up plan: Follow up call scheduled for 02/23/23    Encounter Outcome:  Pt. Visit Completed   Casimer Lanius, [place] [phone]

## 2023-02-20 NOTE — Patient Instructions (Signed)
  It was a pleasure speaking with you today. I am sorry you were unable to keep your phone appointment today.   Per your request a Care Coordination phone appointment is scheduled 02/23/23  Casimer Lanius, [place] Work Care Coordination  [phone]

## 2023-02-23 ENCOUNTER — Ambulatory Visit: Payer: Self-pay | Admitting: Licensed Clinical Social Worker

## 2023-02-23 NOTE — Patient Instructions (Signed)
Visit Information  Thank you for taking time to visit with me today. Please don't hesitate to contact me if I can be of assistance to you.   Following are the goals we discussed today:   Goals Addressed             This Visit's Progress    Reduce stress with mental health support       Activities in order to accomplish goals.   Start / continue relaxed breathing 3 times daily Keep all upcoming appointment discussed today Continue with compliance of taking medication prescribed by Doctor Self Support options  (continue with self-care, going to the [facility], setting healthy boundaries, reclaiming your joy and moments of gratitude )         Our next appointment is by telephone on 03/12/23 at 1:15  Please call the care guide team at [phone] if you need to cancel or reschedule your appointment.   If you are experiencing a Mental Health or [place] or need someone to talk to, please call the Suicide and Crisis Lifeline: 988 call the [place] National Suicide Prevention Lifeline: 4438648017 or TTY: 802 123 4900 TTY (318)171-8673) to talk to a trained counselor call 1-800-273-TALK (toll free, 24 hour hotline) go to [facility] [address] [place] [phone])   Patient verbalizes understanding of instructions and care plan provided today and agrees to view in [place]. Active MyChart status and patient understanding of how to access instructions and care plan via MyChart confirmed with patient.     Casimer Lanius, [place] [phone]

## 2023-02-23 NOTE — Patient Outreach (Signed)
  Care Coordination  Follow Up Visit Note   02/23/2023 Name: Cynthia Lee MRN: [id number] DOB: 06/17/1976  Cynthia Lee is a 47 y.o. year old female who sees Cynthia Lee, Cynthia Lee, Cynthia Lee for primary care. I spoke with  Cynthia Lee by phone today.  What matters to the patients health and wellness today?  Reducing and managing stress  Patient is making great progress with her goal. She will continue interventions discussed with LCSW and activities below to accomplish her goal   Goals Addressed             This Visit's Progress    Reduce stress with mental health support       Activities in order to accomplish goals.   Start / continue relaxed breathing 3 times daily Keep all upcoming appointment discussed today Continue with compliance of taking medication prescribed by Doctor Self Support options  (continue with self-care, going to the [facility], setting healthy boundaries, reclaiming your joy and moments of gratitude )        SDOH assessments and interventions completed:  No  Care Coordination Interventions:  Yes, provided  Interventions Today    Flowsheet Row Most Recent Value  Chronic Disease   Chronic disease during today's visit Diabetes, Hypertension (HTN)  Exercise Interventions   Exercise Discussed/Reviewed Physical Activity  Physical Activity Discussed/Reviewed Physical Activity Discussed, Gym  [has started going to the [place] Reviewed  Nutrition Interventions   Nutrition Discussed/Reviewed Nutrition Discussed, Decreasing sugar intake  [noticed a decrease in stress eating]       Follow up plan: Follow up call scheduled for 3 weeks 03/12/23    Encounter Outcome:  Pt. Visit Completed   Cynthia Lee, [place] [phone]

## 2023-03-01 ENCOUNTER — Telehealth: Payer: Self-pay

## 2023-03-01 NOTE — Patient Outreach (Signed)
  Care Coordination   03/01/2023 Name: Lacinda Sue MRN: [id number] DOB: Jun 01, 1976   Care Coordination Outreach Attempts:  An unsuccessful telephone outreach was attempted for a scheduled appointment today.  Follow Up Plan:  Additional outreach attempts will be made to offer the patient care coordination information and services.   Encounter Outcome:  No Answer   Care Coordination Interventions:  No, not indicated    Thea Silversmith, RN, MSN, BSN, [place] Coordinator [phone]

## 2023-03-05 ENCOUNTER — Other Ambulatory Visit: Payer: BC Managed Care – PPO | Admitting: Pharmacist

## 2023-03-05 NOTE — Patient Instructions (Signed)
[name],  Thank you for speaking with me today. As discussed, I will let [name] know you need a prescription for supplies to check blood sugar.  Here are some tips for checking blood sugar, below:  Check your blood sugars twice daily:  1) Fasting, first thing in the morning before breakfast and  2) 2 hours after your largest meal.   For a goal A1c of less than 7%, goal fasting readings are less than 130 and goal 2 hour after meal readings are less than 180.    Take care, Cynthia Lee, PharmD, BCPS Clinical Pharmacist [facility]

## 2023-03-05 NOTE — Progress Notes (Signed)
03/05/2023 Name: Cynthia Lee MRN: [id number] DOB: 1976-01-11  Chief Complaint  Patient presents with   Medication Assistance    Cynthia Lee is a 47 y.o. year old female who presented for a telephone visit.   They were referred to the pharmacist by their Case Management Team  for assistance in managing medication access.    Subjective:  Care Team: Primary Care Provider: Nolene Ebbs ; Next Scheduled Visit: 02/05/23    Medication Access/Adherence  Current Pharmacy:  [facility] [address] - [place], [place] - [address] [place] [address] Phone: [phone] Fax: [phone]   Patient reports affordability concerns with their medications: No  Patient reports access/transportation concerns to their pharmacy: No  Patient reports adherence concerns with their medications:  No     Diabetes:  Current medications: synjardy XR 10-1000mg  daily, pioglitazone 45mg  daily, mounjaro 5mg  once weekly (RX for 10mg , patient stated her stomach cannot tolerate at 10mg  dosing).  Current glucose readings: 128-138   Objective:  Lab Results  Component Value Date   HGBA1C 9.8 (A) 02/05/2023    Lab Results  Component Value Date   CREATININE 0.80 11/23/2021   BUN 15 11/23/2021   NA 138 11/23/2021   K 4.6 11/23/2021   CL 96 (L) 11/23/2021   CO2 28 11/23/2021    Lab Results  Component Value Date   CHOL 177 11/23/2021   HDL 43 (L) 11/23/2021   LDLCALC 104 (H) 11/23/2021   TRIG 181 (H) 11/23/2021   CHOLHDL 4.1 11/23/2021    Medications Reviewed Today     Reviewed by Gabriel Carina, RPH (Pharmacist) on 03/05/23 at 1426  Med List Status: <None>   Medication Order Taking? Sig Documenting Provider Last Dose Status Informant  AMBULATORY NON FORMULARY MEDICATION [id number]  Glucometer lancets and test strips to check sugars once or twice a day for Diabetic control. Jomarie Longs,  PA-C  Active Self  amLODipine (NORVASC) 2.5 MG tablet [id number]  Take 1 tablet (2.5 mg total) by mouth daily. WILL NEED APPOINTMENT AFTER REFILL Jomarie Longs, PA-C  Active   atorvastatin (LIPITOR) 80 MG tablet [id number]  Take 1 tablet (80 mg total) by mouth daily. Jomarie Longs, PA-C  Active   Empagliflozin-metFORMIN HCl ER (SYNJARDY XR) 08-999 MG TB24 [id number] Yes Take 1 tablet by mouth daily. Jomarie Longs, PA-C Taking Active   ferrous sulfate 325 (65 FE) MG tablet [id number]  Take 1 tablet (325 mg total) by mouth daily with breakfast.  Patient taking differently: Take 325 mg by mouth 2 (two) times a week.   Jomarie Longs, PA-C  Active   Multiple Vitamins-Minerals (MULTIVITAMIN PO) [id number]  Take by mouth. [provider]  Active Self  nystatin-triamcinolone (MYCOLOG II) cream [id number]  Apply topically 2 (two) times daily. Apply to area [place], Lonna Cobb, PA-C  Active   olmesartan-hydrochlorothiazide (BENICAR HCT) 40-25 MG tablet [id number]  Take 1 tablet by mouth daily. Breeback, Jade L, PA-C  Active   pioglitazone (ACTOS) 45 MG tablet [id number] Yes Take 1 tablet (45 mg total) by mouth daily. Jomarie Longs, PA-C Taking Active   tirzepatide [facility]) 5 MG/0.5ML Pen [id number] Yes Inject 5 mg into the skin once a week. Jomarie Longs, PA-C Taking Active   triamcinolone cream (KENALOG) 0.1 % [id number]  APPLY TO AFFECTED AREA TWICE A DAY Breeback, Jade L, PA-C  Active  VITAMIN D PO [id number]  Take 1 tablet by mouth daily. [provider]  Active Self              Assessment/Plan:   Diabetes: - Currently uncontrolled, but addressed by med changes at recent PCP visit - Patient states cost of medications are not a current concern for her, all is going well - Needs RX for glucometer and supplies sent to [facility], will facilitate with PCP - Recommend to continue current medications    Follow Up Plan: as desired by patient   Lynnda Shields,  PharmD Clinical Pharmacist [facility]

## 2023-03-09 ENCOUNTER — Ambulatory Visit: Payer: Self-pay

## 2023-03-09 NOTE — Patient Instructions (Signed)
Visit Information  Thank you for taking time to visit with me today. Please don't hesitate to contact me if I can be of assistance to you.   Following are the goals we discussed today:   Goals Addressed             This Visit's Progress    Diabetes       Interventions Today    Flowsheet Row Most Recent Value  Chronic Disease   Chronic disease during today's visit Diabetes  General Interventions   General Interventions Discussed/Reviewed General Interventions Reviewed, Doctor Visits, Community Resources  [discussed [facility] doctor cooking healthy at [employer] location and Nturition and Diabetes educator classes(referral needed per MD).]  Doctor Visits Discussed/Reviewed Doctor Visits Discussed, PCP  PCP/Specialist Visits Compliance with follow-up visit  Education Interventions   Education Provided Provided Education  Provided Verbal Education On Blood Sugar Monitoring, Community Resources  [reviewed PCP and clinical pharmacist patient instructions: patient has medications and denies any issues. reviewed A1C goal <7%,  fasting ,130 and 2hour after meal <180.]  Nutrition Interventions   Nutrition Discussed/Reviewed Nutrition Reviewed, Carbohydrate meal planning, Portion sizes, Adding fruits and vegetables  Pharmacy Interventions   Pharmacy Dicussed/Reviewed Pharmacy Topics Reviewed           Our next appointment is by telephone on 04/04/23 at 3:30 pm  Please call the care guide team at [phone] if you need to cancel or reschedule your appointment.   If you are experiencing a Mental Health or Behavioral Health Crisis or need someone to talk to, please call the Suicide and Crisis Lifeline: 54  Kathyrn Sheriff, RN, MSN, BSN, CCM [facility] Care Coordinator [phone]

## 2023-03-09 NOTE — Patient Outreach (Signed)
  Care Coordination   Follow Up Visit Note   03/09/2023 Name: Cynthia Lee MRN: [id number] DOB: Oct 11, 1976  Cynthia Lee is a 47 y.o. year old female who sees [place], Lonna Cobb, [place] for primary care. I spoke with  Cynthia Lee by phone today.  What matters to the patients health and wellness today?  I've been doing better, eating better. She states BS  is improving and reports 138- 140 fasting. She continues to work toward better food choices and meal preparation    Goals Addressed             This Visit's Progress    Diabetes       Interventions Today    Flowsheet Row Most Recent Value  Chronic Disease   Chronic disease during today's visit Diabetes  General Interventions   General Interventions Discussed/Reviewed General Interventions Reviewed, Doctor Visits, Community Resources  [discussed [facility] doctor cooking healthy at [employer] location and Nturition and Diabetes educator classes(referral needed per MD).]  Doctor Visits Discussed/Reviewed Doctor Visits Discussed, PCP  PCP/Specialist Visits Compliance with follow-up visit  Education Interventions   Education Provided Provided Education  Provided Verbal Education On Blood Sugar Monitoring, Community Resources  [reviewed PCP and clinical pharmacist patient instructions: patient has medications and denies any issues. reviewed A1C goal <7%,  fasting ,130 and 2hour after meal <180.]  Nutrition Interventions   Nutrition Discussed/Reviewed Nutrition Reviewed, Carbohydrate meal planning, Portion sizes, Adding fruits and vegetables  Pharmacy Interventions   Pharmacy Dicussed/Reviewed Pharmacy Topics Reviewed           SDOH assessments and interventions completed:  Yes  Care Coordination Interventions:  Yes, provided   Follow up plan: Follow up call scheduled for 04/04/23    Encounter Outcome:  Pt. Visit Completed    Kathyrn Sheriff, RN, MSN, BSN, CCM [facility] Care Coordinator [phone]

## 2023-03-11 ENCOUNTER — Ambulatory Visit
Admission: EM | Admit: 2023-03-11 | Discharge: 2023-03-11 | Disposition: A | Discharge status: Home / Self Care | Payer: BC Managed Care – PPO | Attending: Family Medicine | Admitting: Family Medicine

## 2023-03-11 ENCOUNTER — Encounter: Payer: Self-pay | Admitting: Emergency Medicine

## 2023-03-11 DIAGNOSIS — R519 Headache, unspecified: Secondary | ICD-10-CM

## 2023-03-11 NOTE — ED Provider Notes (Signed)
[facility]    CSN: [id number] Arrival date & time: 03/11/23  1526      History   Chief Complaint Chief Complaint  Patient presents with   Facial Pain    HPI Cynthia Lee is a 47 y.o. female.   HPI  Patient states she has had a sinus headache for 24 hours.  She has some postnasal drip, congestion and cough.  She has not taken any over-the-counter medications.  No fever or chills.  No headache or body ache. Patient is supposed to see her grandbaby today.  She wants to make sure she does not have anything more serious for contagiousness reasons Past Medical History:  Diagnosis Date   Abnormal Pap smear, atypical squamous cells of undetermined sign (ASC-US)    Allergy    started 2021   Anemia    Diabetes mellitus without complication    Heart murmur    as a child    Hypertension     Patient Active Problem List   Diagnosis Date Noted   Epistaxis 07/17/2022   Polyp of colon    Uncontrolled type 2 diabetes mellitus with hyperglycemia 10/07/2019   Numbness of left lower extremity 06/25/2018   Perioral dermatitis 05/24/2018   Vitamin D deficiency 05/20/2018   Dyslipidemia, goal LDL below 70 05/20/2018   Yeast infection of the skin 12/05/2016   Irritable bowel syndrome with diarrhea 12/05/2016   Uncontrolled type 2 diabetes mellitus without complication, without long-term current use of insulin 02/08/2016   Metatarsalgia with corn of right foot 09/16/2014   Benign essential hypertension 11/04/2012   Abnormal Pap smear, atypical squamous cells of undetermined sign (ASC-US) 10/31/2012   History of anemia 10/09/2012   Class 3 severe obesity due to excess calories with serious comorbidity and body mass index (BMI) of 50.0 to 59.9 in adult 10/09/2012   Varicose vein of leg 10/09/2012    Past Surgical History:  Procedure Laterality Date   CESAREAN SECTION     x2   CHOLECYSTECTOMY     COLONOSCOPY WITH PROPOFOL N/A 10/12/2020   Procedure: COLONOSCOPY WITH  PROPOFOL;  Surgeon: Tressia Danas, MD;  Location: [facility];  Service: Gastroenterology;  Laterality: N/A;   GALLBLADDER SURGERY     POLYPECTOMY  10/12/2020   Procedure: POLYPECTOMY;  Surgeon: Tressia Danas, MD;  Location: [facility];  Service: Gastroenterology;;   skin graph  2006   WISDOM TOOTH EXTRACTION      OB History   No obstetric history on file.      Home Medications    Prior to Admission medications   Medication Sig Start Date End Date Taking? Authorizing Provider  AMBULATORY NON FORMULARY MEDICATION Glucometer lancets and test strips to check sugars once or twice a day for Diabetic control. 12/31/18   Breeback, Jade L, PA-C  amLODipine (NORVASC) 2.5 MG tablet Take 1 tablet (2.5 mg total) by mouth daily. WILL NEED APPOINTMENT AFTER REFILL 01/22/23   Tandy Gaw L, PA-C  atorvastatin (LIPITOR) 80 MG tablet Take 1 tablet (80 mg total) by mouth daily. 10/16/22   Breeback, Lonna [place], PA-C  Empagliflozin-metFORMIN HCl ER (SYNJARDY XR) 08-999 MG TB24 Take 1 tablet by mouth daily. 02/05/23   Breeback, Jade L, PA-C  ferrous sulfate 325 (65 FE) MG tablet Take 1 tablet (325 mg total) by mouth daily with breakfast. Patient taking differently: Take 325 mg by mouth 2 (two) times a week. 12/05/16   Breeback, Lonna Cobb, PA-C  Multiple Vitamins-Minerals (MULTIVITAMIN PO) Take by mouth.  [provider]  olmesartan-hydrochlorothiazide (BENICAR HCT) 40-25 MG tablet Take 1 tablet by mouth daily. 02/05/23   Breeback, Jade L, PA-C  pioglitazone (ACTOS) 45 MG tablet Take 1 tablet (45 mg total) by mouth daily. 02/05/23   Breeback, Jade L, PA-C  tirzepatide [facility]) 5 MG/0.5ML Pen Inject 5 mg into the skin once a week. 02/05/23   Breeback, Jade L, PA-C  VITAMIN D PO Take 1 tablet by mouth daily.    [provider]    Family History Family History  Problem Relation Age of Onset   Hyperlipidemia Mother        opiate resistant   Hypertension Mother    Alcohol abuse Father     Diabetes Father    Hypertension Father    Hyperlipidemia Father    Heart attack Father    Stroke Paternal Grandmother    Cancer Paternal Grandmother        ovarian cancer   Diabetes Son    Schizophrenia Brother    Colon cancer Neg Hx    Colon polyps Neg Hx    Esophageal cancer Neg Hx    Rectal cancer Neg Hx    Stomach cancer Neg Hx     Social History Social History   Tobacco Use   Smoking status: Never   Smokeless tobacco: Never  Vaping Use   Vaping Use: Never used  Substance Use Topics   Alcohol use: Never   Drug use: Not Currently     Allergies   Penicillins   Review of Systems Review of Systems See HPI  Physical Exam Triage Vital Signs ED Triage Vitals  Enc Vitals Group     BP 03/11/23 1533 (!) 168/82     Pulse Rate 03/11/23 1533 76     Resp 03/11/23 1533 16     Temp 03/11/23 1533 99.1 F (37.3 C)     Temp Source 03/11/23 1533 Oral     SpO2 03/11/23 1533 99 %     Weight 03/11/23 1536 (!) 353 lb (160.1 kg)     Height 03/11/23 1536 5\' 7"  (1.702 m)     Head Circumference --      Peak Flow --      Pain Score 03/11/23 1536 4     Pain Loc --      Pain Edu? --      Excl. in GC? --    No data found.  Updated Vital Signs BP (!) 168/82 (BP Location: Left Arm) Comment: on BP meds - can not remember if she took it this am  Pulse 76   Temp 99.1 F (37.3 C) (Oral)   Resp 16   Ht 5\' 7"  (1.702 m)   Wt (!) 160.1 kg   SpO2 99%   BMI 55.29 kg/m       Physical Exam Constitutional:      General: She is not in acute distress.    Appearance: She is well-developed. She is obese. She is ill-appearing.  HENT:     Head: Normocephalic and atraumatic.     Right Ear: Tympanic membrane and ear canal normal.     Left Ear: Tympanic membrane and ear canal normal.     Ears:     Comments: Left TM has injected periphery    Nose: Congestion and rhinorrhea present.     Mouth/Throat:     Pharynx: Posterior oropharyngeal erythema present.     Comments: Clear  rhinorrhea and postnasal drip.  Maxillary and ethmoid sinuses are  moderately tender Eyes:     Conjunctiva/sclera: Conjunctivae normal.     Pupils: Pupils are equal, round, and reactive to light.     Comments: Discs are flat  Cardiovascular:     Rate and Rhythm: Normal rate and regular rhythm.     Heart sounds: Normal heart sounds.  Pulmonary:     Effort: Pulmonary effort is normal. No respiratory distress.     Breath sounds: Normal breath sounds.  Abdominal:     General: There is no distension.     Palpations: Abdomen is soft.  Musculoskeletal:        General: Normal range of motion.     Cervical back: Normal range of motion.  Lymphadenopathy:     Cervical: No cervical adenopathy.  Skin:    General: Skin is warm and dry.  Neurological:     Mental Status: She is alert.      UC Treatments / Results  Labs (all labs ordered are listed, but only abnormal results are displayed) Labs Reviewed - No data to display  EKG   Radiology No results found.  Procedures Procedures (including critical care time)  Medications Ordered in UC Medications - No data to display  Initial Impression / Assessment and Plan / UC Course  I have reviewed the triage vital signs and the nursing notes.  Pertinent labs & imaging results that were available during my care of the patient were reviewed by me and considered in my medical decision making (see chart for details).      Final Clinical Impressions(s) / UC Diagnoses   Final diagnoses:  Sinus headache     Discharge Instructions      Take over-the-counter medicines.  Consider Claritin, Zyrtec, or Allegra for the allergy symptoms.  Flonase will help with your sinus and nasal congestion.  Ibuprofen is good for the headache.  Call for problems     ED Prescriptions   None    PDMP not reviewed this encounter.   Eustace Moore, MD 03/11/23 [phone]

## 2023-03-11 NOTE — Discharge Instructions (Signed)
Take over-the-counter medicines.  Consider Claritin, Zyrtec, or Allegra for the allergy symptoms.  Flonase will help with your sinus and nasal congestion.  Ibuprofen is good for the headache.  Call for problems

## 2023-03-11 NOTE — ED Triage Notes (Signed)
Sinus headache x 24 hours  Congestion  Sinus drainage  w/  cough  No OTC meds

## 2023-03-12 ENCOUNTER — Encounter: Payer: Self-pay | Admitting: Licensed Clinical Social Worker

## 2023-03-12 ENCOUNTER — Telehealth: Payer: Self-pay | Admitting: Licensed Clinical Social Worker

## 2023-03-12 NOTE — Patient Outreach (Signed)
  Care Coordination   03/12/2023 Name: Meilynn Noy MRN: [id number] DOB: 07/07/1976   Care Coordination Outreach Attempts:  An unsuccessful telephone outreach was attempted for a scheduled appointment today.  Follow Up Plan:  Additional outreach attempts will be made to offer the patient care coordination information and services.   Encounter Outcome:  No Answer   Care Coordination Interventions:  No, not indicated    Sammuel Hines, LCSW Social Work Care Coordination  [name] [employer] [phone]

## 2023-03-12 NOTE — Patient Instructions (Signed)
  I am sorry you were unable to keep your phone appointment today.   The Care Guide will contact you to reschedule the phone appointment  or your can call me to reschedule  Sammuel Hines, LCSW Social Work Care Coordination  [phone]

## 2023-03-25 ENCOUNTER — Other Ambulatory Visit: Payer: Self-pay

## 2023-03-25 DIAGNOSIS — E1165 Type 2 diabetes mellitus with hyperglycemia: Secondary | ICD-10-CM

## 2023-03-25 MED ORDER — LANCET DEVICE MISC: 1.0000 | Freq: Three times a day (TID) | 0 refills | Status: AC

## 2023-03-25 MED ORDER — BLOOD GLUCOSE MONITORING SUPPL DEVI: 1.0000 | Freq: Three times a day (TID) | 0 refills | Status: DC

## 2023-03-25 MED ORDER — BLOOD GLUCOSE TEST VI STRP: 1.0000 | ORAL_STRIP | Freq: Three times a day (TID) | 0 refills | Status: AC

## 2023-03-25 MED ORDER — LANCETS MISC. MISC: 1.0000 | Freq: Three times a day (TID) | 0 refills | Status: AC

## 2023-04-04 ENCOUNTER — Ambulatory Visit: Payer: Self-pay

## 2023-04-04 ENCOUNTER — Telehealth: Payer: Self-pay

## 2023-04-04 NOTE — Patient Outreach (Signed)
  Care Coordination   04/04/2023 Name: Cynthia Lee MRN: [id number] DOB: [birth year]   Care Coordination Outreach Attempts:  An unsuccessful telephone outreach was attempted for a scheduled appointment today.  Follow Up Plan:  Additional outreach attempts will be made to offer the patient care coordination information and services.   Encounter Outcome:  No Answer {THN Tip this will not be part of the note when signed-REQUIRED REPORT FIELD DO NOT DELETE (Optional):27901}  Care Coordination Interventions:  No, not indicated  {THN Tip this will not be part of the note when signed-REQUIRED REPORT FIELD DO NOT DELETE (Optional):27901}  .jw

## 2023-04-04 NOTE — Patient Instructions (Signed)
Visit Information  Thank you for taking time to visit with me today. Please don't hesitate to contact me if I can be of assistance to you.   Following are the goals we discussed today:  Continue to check blood sugar as recommended and contact provider if questions or concerns Continue to take medications as prescribed Call to schedule annual eye exam   If you are experiencing a Mental Health or Behavioral Health Crisis or need someone to talk to, please call the Suicide and Crisis Lifeline: 22  Kathyrn Sheriff, RN, MSN, BSN, CCM [facility] Care Coordinator [phone]

## 2023-04-04 NOTE — Patient Outreach (Signed)
  Care Coordination   Follow Up Visit Note   04/04/2023 Name: Cornesha Hover MRN: [id number] DOB: [birth year]  Letanya Resendes is a 47 y.o. year old female who sees [place], Lonna Cobb, [place] for primary care. I spoke with  [name] by phone today.  What matters to the patients health and wellness today?  Ms. Salatino denies any concerns at this time. Denies any concerns about blood sugar. States she is going to call to schedule appointment with ophthalmology. Ms. Jerabek states she will call RNCM if care coordination, disease management or other resource needs in the future.  Goals Addressed             This Visit's Progress    COMPLETED: Diabetes       Interventions Today    Flowsheet Row Most Recent Value  Chronic Disease   Chronic disease during today's visit Diabetes  General Interventions   General Interventions Discussed/Reviewed General Interventions Reviewed, Communication with  [patient to contact RNCM if care coordination needs in the future]  Communication with Social Work  Civil Service fast streamer with scheduling patient with LCSW]  Education Interventions   Education Provided Provided Education  Provided Verbal Education On Eye Care, Blood Sugar Monitoring  [encouraged to continue to check blood sugars as recommended by provider, discussed annual eye exam and encouraged to schedule]  Nutrition Interventions   Nutrition Discussed/Reviewed Nutrition Reviewed  Pharmacy Interventions   Pharmacy Dicussed/Reviewed Pharmacy Topics Reviewed           SDOH assessments and interventions completed:  No  Care Coordination Interventions:  Yes, provided   Follow up plan: No further intervention required.   Encounter Outcome:  Pt. Visit Completed   Kathyrn Sheriff, RN, MSN, BSN, CCM [facility] Care Coordinator [phone]

## 2023-04-05 NOTE — Patient Outreach (Signed)
  Care Coordination   04/05/2023 Late entry for 04/04/23 3:47 pm  Name: Cynthia Lee MRN: [id number] DOB: 1976/06/28   Care Coordination Outreach Attempts:  An unsuccessful telephone outreach was attempted for a scheduled appointment today.  Follow Up Plan:  Additional outreach attempts will be made to offer the patient care coordination information and services.   Encounter Outcome:  No Answer   Care Coordination Interventions:  No, not indicated    Kathyrn Sheriff, RN, MSN, BSN, CCM [facility] Care Coordinator [phone]

## 2023-04-09 ENCOUNTER — Ambulatory Visit: Payer: Self-pay | Admitting: Licensed Clinical Social Worker

## 2023-04-09 NOTE — Patient Outreach (Signed)
  Care Coordination  Follow Up Visit Note   04/09/2023 Name: Cynthia Lee MRN: [id number] DOB: January 20, 1976  Cynthia Lee is a 47 y.o. year old female who sees [place], Lonna Cobb, [place] for primary care. I spoke with  Cynthia Lee by phone today.  What matters to the patients health and wellness today?  Unable to keep appointment today.    SDOH assessments and interventions completed:  No  Care Coordination Interventions:  No, not indicated   Follow up plan: Follow up call scheduled for 04/16/23    Encounter Outcome:  Pt. Visit Completed   Sammuel Hines, LCSW Social Work Care Coordination  [name] [employer] [phone]

## 2023-04-09 NOTE — Patient Instructions (Signed)
  It was a pleasure speaking with you today. I am sorry you were unable to keep your phone appointment today.   Per your request a Care Coordination phone appointment is scheduled 04/16/23  Sammuel Hines, LCSW Social Work Care Coordination  [phone]

## 2023-04-16 ENCOUNTER — Ambulatory Visit: Payer: Self-pay | Admitting: Licensed Clinical Social Worker

## 2023-04-16 NOTE — Patient Outreach (Signed)
  Care Coordination  Follow Up Visit Note   04/16/2023 Name: Cynthia Lee MRN: [id number] DOB: 1976/10/07  Cynthia Lee is a 47 y.o. year old female who sees Cynthia Lee, Cynthia Lee, [place] for primary care. I spoke with  Cynthia Lee by phone today.  What matters to the patients health and wellness today?  Unable to keep phone appointment today   SDOH assessments and interventions completed:  No   Care Coordination Interventions:  No, not indicated    Follow up plan: Follow up call scheduled for 04/24/23    Encounter Outcome:  Pt. Visit Completed   Cynthia Hines, LCSW Social Work Care Coordination  Cynthia Lee Cynthia Lee Cynthia Lee [phone]

## 2023-04-16 NOTE — Patient Instructions (Signed)
  It was a pleasure speaking with you today. I am sorry you were unable to keep your phone appointment today.   Per your request a Care Coordination phone appointment is scheduled 04/24/23  Juliyah Mergen, LCSW Social Work Care Coordination  [phone]  

## 2023-04-24 ENCOUNTER — Encounter: Payer: Self-pay | Admitting: Licensed Clinical Social Worker

## 2023-04-24 ENCOUNTER — Ambulatory Visit: Payer: Self-pay | Admitting: Licensed Clinical Social Worker

## 2023-04-24 NOTE — Patient Instructions (Signed)
Social Work Visit Information  Thank you for taking time to visit with me today. Please don't hesitate to contact me if I can be of assistance to you.   Following are the goals we discussed today:   Goals Addressed             This Visit's Progress    Reduce stress with mental health support       Activities in order to accomplish goals.   Start / continue relaxed breathing 3 times daily Keep all upcoming appointment discussed today Continue with compliance of taking medication prescribed by Doctor Self Support options  (continue with self-care, going to the [facility], setting healthy boundaries, reclaiming your joy and moments of gratitude ) I am glad you are ready to move forward with finding a therapist.  These are some of the options based on your request and insurance    Clint Lipps   [email or web address]      [employer] [place], [place] [phone]   [facility]  [email or web address]  [facility] [facility] [address] [place], [place] [address]  [phone]   Tiffany Arvilla [employer]  [email or web address]  [address]   [address] [place] [address]  [phone]        Our next appointment is by telephone on 05/07/23 at 3:00   Please call the care guide team at [phone] if you need to cancel or reschedule your appointment.   If you or anyone you know are experiencing a Mental Health or Behavioral Health Crisis or need someone to talk to, please call the Suicide and Crisis Lifeline: 988 call the [place] National Suicide Prevention Lifeline: 336-627-6334 or TTY: 402-431-4184 TTY 901-392-1654) to talk to a trained counselor call 1-800-273-TALK (toll free, 24 hour hotline) go to [facility] [address] [place] [phone])   Patient verbalizes understanding of instructions and care plan provided today and agrees  to view in MyChart. Active MyChart status and patient understanding of how to access instructions and care plan via MyChart confirmed with patient.     Sammuel Hines, LCSW Social Work Care Coordination  [name] [employer] [phone]

## 2023-04-24 NOTE — Patient Outreach (Signed)
  Care Coordination  Follow Up Visit Note   04/24/2023 Name: Cynthia Lee MRN: [id number] DOB: 08-31-1976  Cynthia Lee is a 47 y.o. year old female who sees [place], Lonna Cobb, [place] for primary care. I spoke with  Cynthia Lee by phone today.  What matters to the patients health and wellness today?  Managing her health   Patient is making great progress with managing her health needs.  She has disconnected from care team RN and pharmacy and reports positive steps with maintaining progress from working with both.  She is now ready to move forward with connecting for ongoing therapy.   Goals Addressed             This Visit's Progress    Reduce stress with mental health support       Activities in order to accomplish goals.   Start / continue relaxed breathing 3 times daily Keep all upcoming appointment discussed today Continue with compliance of taking medication prescribed by Doctor Self Support options  (continue with self-care, going to the [facility], setting healthy boundaries, reclaiming your joy and moments of gratitude ) I am glad you are ready to move forward with finding a therapist.  These are some of the options based on your request and insurance    Cynthia Lee   [email or web address]      [employer] [place], [place] [phone]   [facility]  [email or web address]  [employer] [facility] [address] [place], [place] [address]  [phone]   Cynthia Arvilla [employer]  [email or web address]  [address]   [address], [place] [address]  [phone]        SDOH assessments and interventions completed:  Yes SDOH Interventions Today    Flowsheet Row Most Recent Value  SDOH Interventions   Financial Strain Interventions Intervention Not Indicated      Care Coordination Interventions:  Yes, provided  Interventions Today    Flowsheet Row Most Recent Value   Chronic Disease   Chronic disease during today's visit Hypertension (HTN), Diabetes  General Interventions   General Interventions Discussed/Reviewed General Interventions Reviewed, Communication with  Communication with --  [locating a therapist]  Education Interventions   Education Provided Provided Education  Mental Health Interventions   Mental Health Discussed/Reviewed Mental Health Reviewed, Coping Strategies  [solution focused, active listening, problem solving]  Pharmacy Interventions   Pharmacy Dicussed/Reviewed Pharmacy Topics Discussed  [is doing get with affordability with mediation based on meeting with pharmacy team]       Follow up plan: Follow up call scheduled for 05/07/23    Encounter Outcome:  Pt. Visit Completed   Cynthia Hines, LCSW Social Work Care Coordination  [facility] [employer] [employer] [phone]

## 2023-05-07 ENCOUNTER — Ambulatory Visit: Payer: Self-pay | Admitting: Licensed Clinical Social Worker

## 2023-05-07 NOTE — Patient Instructions (Signed)
Social Work Visit Information  Thank you for taking time to visit with me today. Please don't hesitate to contact me if I can be of assistance to you.   Following are the goals we discussed today:   Goals Addressed             This Visit's Progress    Reduce stress and find a therapist       Activities in order to accomplish goals.   Start / continue relaxed breathing 3 times daily Keep all upcoming appointment discussed today Continue with compliance of taking medication prescribed by Doctor Self Support options  (continue with self-care, going to the [facility], setting healthy boundaries, reclaiming your joy and moments of gratitude ) I am glad you are ready to move forward with finding a therapist.  Based on your needs you have chosen Clint Lipps  Per your request I have contacted her schedule an initial consultation.  Please follow up with her if no one has called you in a week. Clint Lipps   [email or web address]      [employer] [place], [place] [phone]) [phone] Phone: [phone] Email: [email or web address] Office: [address], [place] [address]           Our next appointment is by telephone on 06/06/23 at 8:30   Please call the care guide team at [phone] if you need to cancel or reschedule your appointment.   If you or anyone you know are experiencing a Mental Health or Behavioral Health Crisis or need someone to talk to, please call the Suicide and Crisis Lifeline: 988 call the [place] National Suicide Prevention Lifeline: 318-271-9417 or TTY: 985-026-9808 TTY 204-019-1658) to talk to a trained counselor call 1-800-273-TALK (toll free, 24 hour hotline) go to [facility] [address], [place] [phone])   Patient verbalizes understanding of instructions and care plan provided today and agrees to view in MyChart. Active MyChart status and patient understanding of how to  access instructions and care plan via MyChart confirmed with patient.       Sammuel Hines, LCSW Social Work Care Coordination  [name] [employer] [phone]

## 2023-05-07 NOTE — Patient Outreach (Signed)
  Care Coordination  Follow Up Visit Note   05/07/2023 Name: Cynthia Lee MRN: [id number] DOB: 06-02-1976  Cynthia Lee is a 47 y.o. year old female who sees [place], Cynthia Lee, [place] for primary care. I spoke with  Cynthia Lee by phone today.  What matters to the patients health and wellness today?  Finding a therapist  Patient is making progress with her health goals.  She is ready to move forward for ongoing therapy.   Goals Addressed             This Visit's Progress    Reduce stress and find a therapist       Activities in order to accomplish goals.   Start / continue relaxed breathing 3 times daily Keep all upcoming appointment discussed today Continue with compliance of taking medication prescribed by Doctor Self Support options  (continue with self-care, going to the [facility], setting healthy boundaries, reclaiming your joy and moments of gratitude ) I am glad you are ready to move forward with finding a therapist.  Based on your needs you have chosen Cynthia Lee  Per your request I have contacted her schedule an initial consultation.  Please follow up with her if no one has called you in a week. Cynthia Lee   [email or web address]      [employer] [place], [place] [phone] Phone: [phone] Email: [email or web address] Office: [address], [place] [address]          SDOH assessments and interventions completed:  No   Care Coordination Interventions:  Yes, provided  Interventions Today    Flowsheet Row Most Recent Value  Chronic Disease   Chronic disease during today's visit Diabetes  General Interventions   General Interventions Discussed/Reviewed Communication with  Communication with --  [therapist to get patient scheduled]  Mental Health Interventions   Mental Health Discussed/Reviewed Mental Health Reviewed, Coping Strategies  [solution focused, task centered, problem solving]       Follow up plan:  Follow up call scheduled for 30 days    Encounter Outcome:  Pt. Visit Completed   Cynthia Hines, LCSW Social Work Care Coordination  [name] [employer] [phone]

## 2023-05-08 ENCOUNTER — Ambulatory Visit (INDEPENDENT_AMBULATORY_CARE_PROVIDER_SITE_OTHER): Payer: BC Managed Care – PPO | Admitting: Physician Assistant

## 2023-05-08 ENCOUNTER — Encounter: Payer: Self-pay | Admitting: Physician Assistant

## 2023-05-08 VITALS — BP 132/78 | HR 87 | Ht 67.0 in | Wt 359.0 lb

## 2023-05-08 DIAGNOSIS — E1165 Type 2 diabetes mellitus with hyperglycemia: Secondary | ICD-10-CM | POA: Diagnosis not present

## 2023-05-08 DIAGNOSIS — I10 Essential (primary) hypertension: Secondary | ICD-10-CM | POA: Diagnosis not present

## 2023-05-08 DIAGNOSIS — E785 Hyperlipidemia, unspecified: Secondary | ICD-10-CM

## 2023-05-08 DIAGNOSIS — Z7984 Long term (current) use of oral hypoglycemic drugs: Secondary | ICD-10-CM

## 2023-05-08 LAB — POCT GLYCOSYLATED HEMOGLOBIN (HGB A1C): Hemoglobin A1C: 13 % — AB (ref 4.0–5.6)

## 2023-05-08 MED ORDER — SYNJARDY XR 10-1000 MG PO TB24: 1.0000 | ORAL_TABLET | Freq: Every day | ORAL | 0 refills | Status: DC

## 2023-05-08 MED ORDER — AMLODIPINE BESYLATE 2.5 MG PO TABS
2.5000 mg | ORAL_TABLET | Freq: Every day | ORAL | 1 refills | Status: DC
Start: 2023-05-08 — End: 2023-06-20

## 2023-05-08 MED ORDER — TIRZEPATIDE 10 MG/0.5ML ~~LOC~~ SOAJ
10.0000 mg | SUBCUTANEOUS | 0 refills | Status: DC
Start: 2023-06-07 — End: 2024-08-06

## 2023-05-08 MED ORDER — PIOGLITAZONE HCL 45 MG PO TABS: 45.0000 mg | ORAL_TABLET | Freq: Every day | ORAL | 0 refills | Status: DC

## 2023-05-08 MED ORDER — TIRZEPATIDE 7.5 MG/0.5ML ~~LOC~~ SOAJ
7.5000 mg | SUBCUTANEOUS | 0 refills | Status: DC
Start: 2023-05-08 — End: 2024-08-06

## 2023-05-08 NOTE — Patient Instructions (Signed)
10 units every night of lantus Mounjaro and continue all medications

## 2023-05-08 NOTE — Progress Notes (Signed)
Established Patient Office Visit  Subjective   Patient ID: Cynthia Lee, female    DOB: 1976-07-17  Age: 47 y.o. MRN: [id number]  Chief Complaint  Patient presents with   Follow-up   Diabetes    HPI Pt is a 47 yo obese female with T2DM, HTN, HLD who presents to the clinic for 3 month follow up.   Pt is not checking sugars. Denies any hypoglycemic symptoms. No CP, palpitations, headaches or vision changes. A1C 9.6 in November. Tolerating mounjaro well and taking synjardy. Trying to stay active. Not dieting but trying to avoid carbs/sugars.    Patient Active Problem List   Diagnosis Date Noted   Epistaxis 07/17/2022   Polyp of colon    Uncontrolled type 2 diabetes mellitus with hyperglycemia (HCC) 10/07/2019   Numbness of left lower extremity 06/25/2018   Perioral dermatitis 05/24/2018   Vitamin D deficiency 05/20/2018   Dyslipidemia, goal LDL below 70 05/20/2018   Yeast infection of the skin 12/05/2016   Irritable bowel syndrome with diarrhea 12/05/2016   Uncontrolled type 2 diabetes mellitus without complication, without long-term current use of insulin 02/08/2016   Metatarsalgia with corn of right foot 09/16/2014   Benign essential hypertension 11/04/2012   Abnormal Pap smear, atypical squamous cells of undetermined sign (ASC-US) 10/31/2012   History of anemia 10/09/2012   Class 3 severe obesity due to excess calories with serious comorbidity and body mass index (BMI) of 50.0 to 59.9 in adult [facility]) 10/09/2012   Varicose vein of leg 10/09/2012   Past Medical History:  Diagnosis Date   Abnormal Pap smear, atypical squamous cells of undetermined sign (ASC-US)    Allergy    started 2021   Anemia    Diabetes mellitus without complication (HCC)    Heart murmur    as a child    Hypertension    Family History  Problem Relation Age of Onset   Hyperlipidemia Mother        opiate resistant   Hypertension Mother    Alcohol abuse Father    Diabetes Father    Hypertension  Father    Hyperlipidemia Father    Heart attack Father    Stroke Paternal Grandmother    Cancer Paternal Grandmother        ovarian cancer   Diabetes Son    Schizophrenia Brother    Colon cancer Neg Hx    Colon polyps Neg Hx    Esophageal cancer Neg Hx    Rectal cancer Neg Hx    Stomach cancer Neg Hx    Allergies  Allergen Reactions   Penicillins Other (See Comments)    As a child      ROS   See HPI.  Objective:     BP 132/78 (BP Location: Right Arm, Patient Position: Sitting, Cuff Size: Large)   Pulse 87   Ht 5\' 7"  (1.702 m)   Wt (!) 359 lb (162.8 kg)   SpO2 99%   BMI 56.23 kg/m  BP Readings from Last 3 Encounters:  05/08/23 132/78  03/11/23 (!) 168/82  02/05/23 (!) 107/57   Wt Readings from Last 3 Encounters:  05/08/23 (!) 359 lb (162.8 kg)  03/11/23 (!) 353 lb (160.1 kg)  02/05/23 (!) 364 lb (165.1 kg)    .[name] Kitchen Results for orders placed or performed in visit on 05/08/23  POCT HgB A1C  Result Value Ref Range   Hemoglobin A1C 13.0 (A) 4.0 - 5.6 %   HbA1c POC (<> result, manual entry)  HbA1c, POC (prediabetic range)     HbA1c, POC (controlled diabetic range)       Physical Exam Constitutional:      Appearance: Normal appearance. She is obese.  HENT:     Head: Normocephalic.  Cardiovascular:     Rate and Rhythm: Normal rate and regular rhythm.  Pulmonary:     Effort: Pulmonary effort is normal.     Breath sounds: Normal breath sounds.  Musculoskeletal:     Right lower leg: Edema present.     Left lower leg: Edema present.  Neurological:     General: No focal deficit present.     Mental Status: She is alert and oriented to person, place, and time.  Psychiatric:        Mood and Affect: Mood normal.       The 10-year ASCVD risk score (Arnett DK, et al., 2019) is: 3.4%    Assessment & Plan:  [name] KitchenMarland KitchenEssa was seen today for follow-up and diabetes.  Diagnoses and all orders for this visit:  Uncontrolled type 2 diabetes mellitus with  hyperglycemia (HCC) -     POCT HgB A1C -     tirzepatide (MOUNJARO) 7.5 MG/0.5ML Pen; Inject 7.5 mg into the skin once a week. -     tirzepatide (MOUNJARO) 10 MG/0.5ML Pen; Inject 10 mg into the skin once a week. -     Empagliflozin-metFORMIN HCl ER (SYNJARDY XR) 08-999 MG TB24; Take 1 tablet by mouth daily. -     pioglitazone (ACTOS) 45 MG tablet; Take 1 tablet (45 mg total) by mouth daily.  Benign essential hypertension -     amLODipine (NORVASC) 2.5 MG tablet; Take 1 tablet (2.5 mg total) by mouth daily.  Dyslipidemia, goal LDL below 70  Class 3 severe obesity due to excess calories with serious comorbidity and body mass index (BMI) of 50.0 to 59.9 in adult (HCC)   A1C worsening and very elevated Increased mounjaro, added actos and continue on synjardy Give sample pen of lantus to start 10 units every night for the next month or until pen runs out while mounjaro is getting in system Need to start checking morning glucose BP to goal Continue on lipitor .[name] Kitchen Diabetic Foot Exam - Simple   Simple Foot Form Diabetic Foot exam was performed with the following findings: Yes 05/08/2023  3:40 PM  Visual Inspection No deformities, no ulcerations, no other skin breakdown bilaterally: Yes Sensation Testing Intact to touch and monofilament testing bilaterally: Yes Pulse Check Posterior Tibialis and Dorsalis pulse intact bilaterally: Yes Comments    Needs eye exam.  Vaccines UTD Follow up in 3 months    Return in about 3 months (around 08/08/2023).    Tandy Gaw, PA-C

## 2023-05-11 ENCOUNTER — Encounter: Payer: Self-pay | Admitting: Physician Assistant

## 2023-05-17 ENCOUNTER — Encounter: Payer: Self-pay | Admitting: Physician Assistant

## 2023-05-24 DIAGNOSIS — F4389 Other reactions to severe stress: Secondary | ICD-10-CM | POA: Diagnosis not present

## 2023-05-30 DIAGNOSIS — F4389 Other reactions to severe stress: Secondary | ICD-10-CM | POA: Diagnosis not present

## 2023-06-05 ENCOUNTER — Other Ambulatory Visit: Payer: Self-pay | Admitting: Physician Assistant

## 2023-06-05 DIAGNOSIS — E1165 Type 2 diabetes mellitus with hyperglycemia: Secondary | ICD-10-CM

## 2023-06-06 ENCOUNTER — Ambulatory Visit: Payer: Self-pay | Admitting: Licensed Clinical Social Worker

## 2023-06-06 NOTE — Patient Instructions (Signed)
Social Work Visit Information  Thank you for taking time to visit with me today. Please don't hesitate to contact me if I can be of assistance to you.   Following are the goals we discussed today:   Goals Addressed             This Visit's Progress    Care Coordination Activties with Social Work       Activities in order to accomplish goals.   Start / continue relaxed breathing 3 times daily Keep all upcoming appointment discussed today Continue with compliance of taking medication prescribed by Doctor Self Support options  (continue with self-care, going to the [facility], setting healthy boundaries, reclaiming your joy and moments of gratitude ) Complete Advance Directive packet,  Have advance directive notarized and provide a copy to provider office  Review EMMI educational information on Advance Directive. Look for an e-mail from [facility]. Please continue with your weekly session with  [name]   [email or web address]      [employer] [place], [place] [phone] Phone: [phone] Email: [email or web address] Office: [address] [place] [address]           Our next appointment is by telephone on 06/27/23 at 3:00   Please call the care guide team at [phone] if you need to cancel or reschedule your appointment.   If you or anyone you know are experiencing a Mental Health or Behavioral Health Crisis or need someone to talk to, please call the Suicide and Crisis Lifeline: 988 call the [place] National Suicide Prevention Lifeline: (646)301-8665 or TTY: (223)310-4043 TTY 647 055 4048) to talk to a trained counselor call 1-800-273-TALK (toll free, 24 hour hotline) go to [facility] [address], [place] [phone])   Patient verbalizes understanding of instructions and care plan provided today and agrees to view in MyChart. Active MyChart status  and patient understanding of how to access instructions and care plan via MyChart confirmed with patient.       Sammuel Hines, LCSW Social Work Care Coordination  [facility] [name] [phone]

## 2023-06-06 NOTE — Patient Outreach (Signed)
  Care Coordination  Follow Up Visit Note   06/06/2023 Name: Cynthia Lee MRN: [id number] DOB: 11/11/1976  Cynthia Lee is a 47 y.o. year old female who sees Cynthia Lee, Cynthia Lee, [place] for primary care. I spoke with  Cynthia Lee by phone today.  What matters to the patients health and wellness today?  Managing her diabetes  Patient is making progress with goals.  She has connected with a therapist and will focus on advance direction and managing her diabetes.   Goals Addressed             This Visit's Progress    Care Coordination Activties with Social Work       Activities in order to accomplish goals.   Start / continue relaxed breathing 3 times daily Keep all upcoming appointment discussed today Continue with compliance of taking medication prescribed by Doctor Self Support options  (continue with self-care, going to the [facility], setting healthy boundaries, reclaiming your joy and moments of gratitude ) Complete Advance Directive packet,  Have advance directive notarized and provide a copy to provider office  Review EMMI educational information on Advance Directive. Look for an e-mail from [facility]. Please continue with your weekly session with  Cynthia Lee   [email or web address]      [employer] [place], [place] [phone] Phone: [phone] Email: [email or web address] Office: [phone], [place] [address]          SDOH assessments and interventions completed:  No   Care Coordination Interventions:  Yes, provided  Interventions Today    Flowsheet Row Most Recent Value  Chronic Disease   Chronic disease during today's visit Diabetes  General Interventions   General Interventions Discussed/Reviewed General Interventions Reviewed, Referral to Nurse  Exercise Interventions   Exercise Discussed/Reviewed Exercise Discussed, Physical Activity  Physical Activity Discussed/Reviewed Physical  Activity Discussed, Gym  Ninette.Perish active]  Education Interventions   Education Provided Provided Education, Provided Web-based Education  Mental Health Interventions   Mental Health Discussed/Reviewed Mental Health Reviewed  [has connected with her therapist and meeting with weekly]  Advanced Directive Interventions   Advanced Directives Discussed/Reviewed Advanced Directives Discussed, Provided resource for acquiring and filling out documents       Follow up plan:  Referral made to RN for diabetes education  Follow up call scheduled for 06/27/23    Encounter Outcome:  Pt. Visit Completed   Cynthia Hines, LCSW Social Work Care Coordination  [facility] [name] [phone]

## 2023-06-07 DIAGNOSIS — F4389 Other reactions to severe stress: Secondary | ICD-10-CM | POA: Diagnosis not present

## 2023-06-19 ENCOUNTER — Encounter: Payer: Self-pay | Admitting: Physician Assistant

## 2023-06-19 DIAGNOSIS — E785 Hyperlipidemia, unspecified: Secondary | ICD-10-CM

## 2023-06-19 DIAGNOSIS — I10 Essential (primary) hypertension: Secondary | ICD-10-CM

## 2023-06-19 DIAGNOSIS — E1165 Type 2 diabetes mellitus with hyperglycemia: Secondary | ICD-10-CM

## 2023-06-20 MED ORDER — OLMESARTAN MEDOXOMIL-HCTZ 40-25 MG PO TABS
1.0000 | ORAL_TABLET | Freq: Every day | ORAL | 0 refills | Status: DC
Start: 2023-06-20 — End: 2024-08-06

## 2023-06-20 MED ORDER — PIOGLITAZONE HCL 45 MG PO TABS
45.0000 mg | ORAL_TABLET | Freq: Every day | ORAL | 0 refills | Status: DC
Start: 2023-06-20 — End: 2024-08-06

## 2023-06-20 MED ORDER — ATORVASTATIN CALCIUM 80 MG PO TABS
80.0000 mg | ORAL_TABLET | Freq: Every day | ORAL | 0 refills | Status: DC
Start: 2023-06-20 — End: 2024-08-06

## 2023-06-20 MED ORDER — AMLODIPINE BESYLATE 2.5 MG PO TABS
2.5000 mg | ORAL_TABLET | Freq: Every day | ORAL | 0 refills | Status: DC
Start: 2023-06-20 — End: 2024-08-06

## 2023-06-27 ENCOUNTER — Ambulatory Visit: Payer: Self-pay | Admitting: Licensed Clinical Social Worker

## 2023-06-27 LAB — HM DIABETES EYE EXAM

## 2023-06-27 NOTE — Patient Instructions (Signed)
Social Work Visit Information  Thank you for taking time to visit with me today.  It has been a pleasure working with our over the past several months. Please don't hesitate to contact me if I can be of assistance to you.   Following are the goals we discussed today:   Goals Addressed             This Visit's Progress    COMPLETED: Care Coordination Activties with Social Work       Activities in order to accomplish goals.   Start / continue relaxed breathing 3 times daily Keep all upcoming appointment discussed today Continue with compliance of taking medication prescribed by Doctor Self Support options  (continue with self-care, going to the [facility], setting healthy boundaries, reclaiming your joy and moments of gratitude ) Complete Advance Directive packet,  Have advance directive notarized and provide a copy to provider office  Review EMMI educational information on Advance Directive. Look for an e-mail from [facility]. Please continue with your weekly session with  Clint Lipps    [email or web address]      [employer]            Patient does not require continued follow-up by social work. They will contact the office if needed  Please call the care guide team at [phone] if you need to cancel or reschedule your appointment.   If you or anyone you know are experiencing a Mental Health or Behavioral Health Crisis or need someone to talk to, please call the Suicide and Crisis Lifeline: 988 call the [place] National Suicide Prevention Lifeline: 780-107-4049 or TTY: (325) 015-5284 TTY 304-359-3555) to talk to a trained counselor call 1-800-273-TALK (toll free, 24 hour hotline) go to [facility] [address] [place] [phone])   Patient verbalizes understanding of instructions and care plan provided today and agrees to view in MyChart. Active MyChart status and patient understanding of how to  access instructions and care plan via MyChart confirmed with patient.      Sammuel Hines, LCSW Social Work Care Coordination  [facility] [name] [phone]

## 2023-06-27 NOTE — Patient Outreach (Signed)
  Care Coordination  Follow Up Visit Note   06/27/2023 Name: Cynthia Lee MRN: [id number] DOB: [birth year]  Cynthia Lee is a 47 y.o. year old female who sees [place], Lonna Cobb, [place] for primary care. I spoke with  Cynthia Lee by phone today.  What matters to the patients health and wellness today?    Patient has made progress with managing her mental health. She has connected with a therapist and has established a great relationship.  Patient will reconnect and continue working with RN on managing her chronic health conditions.   Goals Addressed             This Visit's Progress    COMPLETED: Care Coordination Activties with Social Work       Activities in order to accomplish goals.   Start / continue relaxed breathing 3 times daily Keep all upcoming appointment discussed today Continue with compliance of taking medication prescribed by Doctor Self Support options  (continue with self-care, going to the [facility], setting healthy boundaries, reclaiming your joy and moments of gratitude ) Complete Advance Directive packet,  Have advance directive notarized and provide a copy to provider office  Review EMMI educational information on Advance Directive. Look for an e-mail from [facility]. Please continue with your weekly session with  Cynthia Lee    [email or web address]      [employer] [facility]           SDOH assessments and interventions completed:  Yes  SDOH Interventions Today    Flowsheet Row Most Recent Value  SDOH Interventions   Health Literacy Interventions Intervention Not Indicated       Care Coordination Interventions:  Yes, provided  Interventions Today    Flowsheet Row Most Recent Value  Chronic Disease   Chronic disease during today's visit Diabetes, Hypertension (HTN)  General Interventions   General Interventions Discussed/Reviewed General Interventions Reviewed  Exercise Interventions   Exercise Discussed/Reviewed Exercise  Reviewed  Physical Activity Discussed/Reviewed Physical Activity Reviewed  Education Interventions   Education Provided Provided Education  Provided Verbal Education On Mental Health/Coping with Illness  [active listening, motivational interviewing]  Mental Health Interventions   Mental Health Discussed/Reviewed Mental Health Reviewed  [contiues with therapy]  Nutrition Interventions   Nutrition Discussed/Reviewed Nutrition Reviewed  Advanced Directive Interventions   Advanced Directives Discussed/Reviewed Advanced Directives Discussed  [will complete document and provided copy for chart]       Follow up plan: No further intervention required for LCSW  Encounter Outcome:  Pt. Visit Completed   Cynthia Hines, LCSW Social Work Care Coordination  [facility] Cynthia Lee [employer] [phone]

## 2023-07-02 ENCOUNTER — Telehealth: Payer: Self-pay

## 2023-07-02 NOTE — Patient Outreach (Signed)
  Care Coordination   07/02/2023 Name: Cynthia Lee MRN: [id number] DOB: 10/14/1976   Care Coordination Outreach Attempts:  An unsuccessful telephone outreach was attempted for a scheduled appointment today.  Follow Up Plan:  Additional outreach attempts will be made to offer the patient care coordination information and services.   Encounter Outcome:  No Answer   Care Coordination Interventions:  No, not indicated    Kathyrn Sheriff, RN, MSN, BSN, CCM [facility] Care Coordinator [phone]

## 2023-07-12 ENCOUNTER — Encounter: Payer: Self-pay | Admitting: Physician Assistant

## 2023-08-01 ENCOUNTER — Ambulatory Visit: Payer: Self-pay

## 2023-08-01 NOTE — Patient Outreach (Signed)
  Care Coordination   Care coordination  Visit Note   08/01/2023 Name: Cynthia Lee MRN: [id number] DOB: 1976/05/16  Cynthia Lee is a 47 y.o. year old female who sees [place], Lonna Cobb, [place] for primary care. I spoke with  Cynthia Lee by phone today.  What matters to the patients health and wellness today?  Per review of chart, on 06/27/23 per LCSW, patient to reconnect with RNCM. RNCM spoke with Cynthia Lee today who states this is not a good time to talk. She request to reschedule call.   Goals Addressed             This Visit's Progress    Care Coordination Activities       Interventions Today    Flowsheet Row Most Recent Value  General Interventions   General Interventions Discussed/Reviewed General Interventions Reviewed  [Patient rescheduled to follow up next week per patient request.]          SDOH assessments and interventions completed:  No  Care Coordination Interventions:  Yes, provided   Follow up plan: Follow up call scheduled for 08/01/23    Encounter Outcome:  Patient Visit Completed   Kathyrn Sheriff, RN, MSN, BSN, CCM Care Management Coordinator [phone]

## 2023-08-08 ENCOUNTER — Ambulatory Visit: Payer: BC Managed Care – PPO | Admitting: Physician Assistant

## 2023-08-17 ENCOUNTER — Ambulatory Visit: Payer: BC Managed Care – PPO | Admitting: Physician Assistant

## 2023-08-31 ENCOUNTER — Ambulatory Visit: Payer: BC Managed Care – PPO | Admitting: Physician Assistant

## 2023-12-28 ENCOUNTER — Ambulatory Visit: Payer: Self-pay | Admitting: Physician Assistant

## 2023-12-28 NOTE — Telephone Encounter (Signed)
  Chief Complaint: cough  Symptoms: congestion, runny nose Frequency: yesterday Pertinent Negatives: Patient denies fever, SOB, chest pain Disposition: [] ED /[] Urgent Care (no appt availability in office) / [] Appointment(In office/virtual)/ []  [place] [facility]/ [x] Home Care/ [] Refused Recommended Disposition /[] [place] [facility]/ []  Follow-up with PCP Additional Notes: Patient with c/o cough, congestion, runny nose x 1 day. Denies fevers, SOB, chest pain. Endorses chills and exposure to flu and RSV. [facility] reviewed home care and encouraged going to UC over weekend for worsening sx. Patient verbalized understanding and to call back with worsening symptoms.    Copied from CRM [id number]. Topic: Clinical - Red Word Triage >> Dec 28, 2023  4:32 PM Cynthia Lee wrote: Red Word that prompted transfer to Nurse Triage: Patient isn't feeling well. She has had a low grade fever with cough and runny nose. Reason for Disposition  Cough with cold symptoms (e.g., runny nose, postnasal drip, throat clearing)  Answer Assessment - Initial Assessment Questions 1. ONSET: "When did the cough begin?"      Last night 2. SEVERITY: "How bad is the cough today?"      consistent 3. SPUTUM: "Describe the color of your sputum" (none, dry cough; clear, white, yellow, green)     I didn't look  5. DIFFICULTY BREATHING: "Are you having difficulty breathing?" If Yes, ask: "How bad is it?" (e.g., mild, moderate, severe)    - MILD: No SOB at rest, mild SOB with walking, speaks normally in sentences, can lie down, no retractions, pulse < 100.    - MODERATE: SOB at rest, SOB with minimal exertion and prefers to sit, cannot lie down flat, speaks in phrases, mild retractions, audible wheezing, pulse 100-120.    - SEVERE: Very SOB at rest, speaks in single words, struggling to breathe, sitting hunched forward, retractions, pulse > 120      no 6. FEVER: "Do you have a fever?" If Yes, ask: "What is your temperature, how  was it measured, and when did it start?"     Chills but afebrile 7. CARDIAC HISTORY: "Do you have any history of heart disease?" (e.g., heart attack, congestive heart failure)      no 8. LUNG HISTORY: "Do you have any history of lung disease?"  (e.g., pulmonary embolus, asthma, emphysema)     no 9. PE RISK FACTORS: "Do you have a history of blood clots?" (or: recent major surgery, recent prolonged travel, bedridden)     no 10. OTHER SYMPTOMS: "Do you have any other symptoms?" (e.g., runny nose, wheezing, chest pain)       Runny nose, congestion Denies chest pain, wheezing  12. TRAVEL: "Have you traveled out of the country in the last month?" (e.g., travel history, exposures)       No travel Exposed to flu, RSV in classroom  Protocols used: Cough - Acute Productive-A-AH

## 2024-06-27 ENCOUNTER — Encounter: Payer: Self-pay | Admitting: Family Medicine

## 2024-06-27 ENCOUNTER — Ambulatory Visit: Payer: Self-pay | Admitting: Family Medicine

## 2024-06-27 VITALS — BP 124/90 | HR 100 | Temp 98.1°F | Resp 15 | Ht 66.5 in | Wt 341.0 lb

## 2024-06-27 DIAGNOSIS — E1165 Type 2 diabetes mellitus with hyperglycemia: Secondary | ICD-10-CM

## 2024-06-27 DIAGNOSIS — Z7189 Other specified counseling: Secondary | ICD-10-CM

## 2024-06-27 DIAGNOSIS — I10 Essential (primary) hypertension: Secondary | ICD-10-CM

## 2024-06-27 LAB — GLUCOSE, POCT (MANUAL RESULT ENTRY): POC Glucose: 374 mg/dL — AB (ref 70–99)

## 2024-06-27 NOTE — Progress Notes (Signed)
 UNABLE TO TAKE MUOUNJARO DUE TO DIARRHEA, TAKING METFORMIN  COMBO1000 MG ONCE DAILY  Current Outpatient Medications on File Prior to Visit  Medication Sig Dispense Refill   AMBULATORY NON FORMULARY MEDICATION Glucometer lancets and test strips to check sugars once or twice a day for Diabetic control. 100 each 1   atorvastatin  (LIPITOR) 80 MG tablet Take 1 tablet (80 mg total) by mouth daily. 30 tablet 0   Empagliflozin -metFORMIN  HCl ER (SYNJARDY  XR) 08-999 MG TB24 Take 1 tablet by mouth daily. 90 tablet 0   ferrous sulfate  325 (65 FE) MG tablet Take 1 tablet (325 mg total) by mouth daily with breakfast. (Patient taking differently: Take 325 mg by mouth 2 (two) times a week.) 30 tablet 5   Multiple Vitamins-Minerals (MULTIVITAMIN PO) Take by mouth.     olmesartan -hydrochlorothiazide  (BENICAR  HCT) 40-25 MG tablet Take 1 tablet by mouth daily. 30 tablet 0   pioglitazone  (ACTOS ) 45 MG tablet Take 1 tablet (45 mg total) by mouth daily. 30 tablet 0   VITAMIN D  PO Take 1 tablet by mouth daily.     amLODipine  (NORVASC ) 2.5 MG tablet Take 1 tablet (2.5 mg total) by mouth daily. (Patient not taking: Reported on 06/27/2024) 30 tablet 0   Blood Glucose Monitoring Suppl DEVI 1 each by Does not apply route in the morning, at noon, and at bedtime. May substitute to any manufacturer covered by patient's insurance. (Patient not taking: Reported on 06/27/2024) 1 each 0   tirzepatide  (MOUNJARO ) 10 MG/0.5ML Pen Inject 10 mg into the skin once a week. (Patient not taking: Reported on 06/27/2024) 6 mL 0   tirzepatide  (MOUNJARO ) 7.5 MG/0.5ML Pen Inject 7.5 mg into the skin once a week. (Patient not taking: Reported on 06/27/2024) 2 mL 0   No current facility-administered medications on file prior to visit.     UNABLE TO TOLERATE MOUNJARO  Lab Results  Component Value Date   HGBA1C 13.0 (A) 05/08/2023   HGBA1C 9.8 (A) 02/05/2023   HGBA1C 9.6 (A) 10/16/2022     Vitals:   06/27/24 1559  BP: (!) 124/90  Pulse: 100   Resp: 15  Temp: 98.1 F (36.7 C)  SpO2: 98%    PLAN: Type 2 Diabetes Mellitus  Medications:  [x]  Continue / adjust current meds  []  Metformin  [id number] mg BID  []  Add GLP-1 RA or SGLT2i (if indicated by A1c, CV/renal benefit)  [x]  Reinforce med adherence  Labs Ordered:  []  A1c  []  CMP (renal function)  []  Lipid panel  []  UACR (annually)  Monitoring:  []  SMBG: Target FBG 80-130 mg/dL  []  Recheck A1c in 3 months  [x]  Encourage log of readings  Lifestyle Counseling:  [x]  Diet: ADA low-carb, portion control  [x]  Exercise: 150 min/week  [x]  Weight goals discussed  Education:  []  Hypo/hyperglycemia signs reviewed  [x]  Sick day management  []  Foot care emphasized  []  Referral to DSME if needed  Follow-Up:  []  RTC in 3 months or sooner if uncontrolled  []  Refer to Endocrinology if A1c >10% despite therapy   SEEING PCP SOON FOR RECHECK HGA1C AND MAINTENANCE PLAN ENCOURAGED AND EDUCATED ABOUT GLP 2, BENEFIT AND MAINTENANCE.  eDUCATION PRINTED AND PROVIDED.

## 2024-07-22 LAB — HM DIABETES EYE EXAM

## 2024-07-24 DIAGNOSIS — F4389 Other reactions to severe stress: Secondary | ICD-10-CM | POA: Diagnosis not present

## 2024-07-25 ENCOUNTER — Ambulatory Visit: Admitting: Medical-Surgical

## 2024-07-25 NOTE — Progress Notes (Deleted)
        Established patient visit   History of Present Illness   Discussed the use of AI scribe software for clinical note transcription with the patient, who gave verbal consent to proceed.  History of Present Illness            Physical Exam   Physical Exam  Assessment & Plan   Assessment and Plan               Follow up   No follow-ups on file.  __________________________________ Cynthia FREDRIK Palin, DNP, APRN, FNP-BC Primary Care and Sports Medicine [facility] [place]

## 2024-07-27 ENCOUNTER — Telehealth: Admitting: Family

## 2024-07-27 DIAGNOSIS — J209 Acute bronchitis, unspecified: Secondary | ICD-10-CM | POA: Diagnosis not present

## 2024-07-27 MED ORDER — FLUTICASONE PROPIONATE 50 MCG/ACT NA SUSP
2.0000 | Freq: Every day | NASAL | 6 refills | Status: AC
Start: 2024-07-27 — End: ?

## 2024-07-27 MED ORDER — BENZONATATE 200 MG PO CAPS: 200.0000 mg | ORAL_CAPSULE | Freq: Two times a day (BID) | ORAL | 0 refills | Status: DC | PRN

## 2024-07-27 MED ORDER — PROMETHAZINE-DM 6.25-15 MG/5ML PO SYRP: 5.0000 mL | ORAL_SOLUTION | Freq: Three times a day (TID) | ORAL | 0 refills | Status: DC | PRN

## 2024-07-27 NOTE — Progress Notes (Signed)
 Virtual Visit Consent   Cynthia Lee, you are scheduled for a virtual visit with a [place] provider today. Just as with appointments in the office, your consent must be obtained to participate. Your consent will be active for this visit and any virtual visit you may have with one of our providers in the next 365 days. If you have a MyChart account, a copy of this consent can be sent to you electronically.  As this is a virtual visit, video technology does not allow for your provider to perform a traditional examination. This may limit your provider's ability to fully assess your condition. If your provider identifies any concerns that need to be evaluated in person or the need to arrange testing (such as labs, EKG, etc.), we will make arrangements to do so. Although advances in technology are sophisticated, we cannot ensure that it will always work on either your end or our end. If the connection with a video visit is poor, the visit may have to be switched to a telephone visit. With either a video or telephone visit, we are not always able to ensure that we have a secure connection.  By engaging in this virtual visit, you consent to the provision of healthcare and authorize for your insurance to be billed (if applicable) for the services provided during this visit. Depending on your insurance coverage, you may receive a charge related to this service.  I need to obtain your verbal consent now. Are you willing to proceed with your visit today? Cynthia Lee has provided verbal consent on 07/27/2024 for a virtual visit (video or telephone). Cynthia Learn, FNP  Date: 07/27/2024 3:21 PM   Virtual Visit via Video Note   I, Cynthia Lee, connected with  Cynthia Lee  ([id number], [birth year]) on 07/27/24 at  3:15 PM EDT by a video-enabled telemedicine application and verified that I am speaking with the correct person using two identifiers.  Location: Patient: Virtual Visit Location Patient:  Home Provider: Virtual Visit Location Provider: Home Office   I discussed the limitations of evaluation and management by telemedicine and the availability of in person appointments. The patient expressed understanding and agreed to proceed.    History of Present Illness: Cynthia Lee is a 48 y.o. who identifies as a female who was assigned female at birth, and is being seen today for cough and congestion that started 3 days ago.  HPI: Cough This is a new problem. The current episode started in the past 7 days. The problem has been waxing and waning. The problem occurs every few minutes. The cough is Non-productive. Associated symptoms include headaches, nasal congestion, postnasal drip and wheezing. Pertinent negatives include no chills, ear congestion, ear pain, fever, myalgias, sore throat or shortness of breath. She has tried rest and OTC cough suppressant for the symptoms. The treatment provided mild relief. Her past medical history is significant for bronchitis.    Problems:  Patient Active Problem List   Diagnosis Date Noted   Epistaxis 07/17/2022   Polyp of colon    Uncontrolled type 2 diabetes mellitus with hyperglycemia (HCC) 10/07/2019   Numbness of left lower extremity 06/25/2018   Perioral dermatitis 05/24/2018   Vitamin D  deficiency 05/20/2018   Dyslipidemia, goal LDL below 70 05/20/2018   Yeast infection of the skin 12/05/2016   Irritable bowel syndrome with diarrhea 12/05/2016   Uncontrolled type 2 diabetes mellitus without complication, without long-term current use of insulin  02/08/2016   Metatarsalgia with corn of right foot 09/16/2014  Benign essential hypertension 11/04/2012   Abnormal Pap smear, atypical squamous cells of undetermined sign (ASC-US ) 10/31/2012   History of anemia 10/09/2012   Class 3 severe obesity due to excess calories with serious comorbidity and body mass index (BMI) of 50.0 to 59.9 in adult 10/09/2012   Varicose vein of leg 10/09/2012     Allergies:  Allergies  Allergen Reactions   Penicillins Other (See Comments)    As a child   Medications:  Current Outpatient Medications:    benzonatate  (TESSALON ) 200 MG capsule, Take 1 capsule (200 mg total) by mouth 2 (two) times daily as needed for cough., Disp: 20 capsule, Rfl: 0   fluticasone  (FLONASE ) 50 MCG/ACT nasal spray, Place 2 sprays into both nostrils daily., Disp: 16 g, Rfl: 6   promethazine -dextromethorphan (PROMETHAZINE -DM) 6.25-15 MG/5ML syrup, Take 5 mLs by mouth 3 (three) times daily as needed for cough., Disp: 118 mL, Rfl: 0   AMBULATORY NON FORMULARY MEDICATION, Glucometer lancets and test strips to check sugars once or twice a day for Diabetic control., Disp: 100 each, Rfl: 1   amLODipine  (NORVASC ) 2.5 MG tablet, Take 1 tablet (2.5 mg total) by mouth daily. (Patient not taking: Reported on 06/27/2024), Disp: 30 tablet, Rfl: 0   atorvastatin  (LIPITOR) 80 MG tablet, Take 1 tablet (80 mg total) by mouth daily., Disp: 30 tablet, Rfl: 0   Blood Glucose Monitoring Suppl DEVI, 1 each by Does not apply route in the morning, at noon, and at bedtime. May substitute to any manufacturer covered by patient's insurance. (Patient not taking: Reported on 06/27/2024), Disp: 1 each, Rfl: 0   Empagliflozin -metFORMIN  HCl ER (SYNJARDY  XR) 08-999 MG TB24, Take 1 tablet by mouth daily., Disp: 90 tablet, Rfl: 0   ferrous sulfate  325 (65 FE) MG tablet, Take 1 tablet (325 mg total) by mouth daily with breakfast. (Patient taking differently: Take 325 mg by mouth 2 (two) times a week.), Disp: 30 tablet, Rfl: 5   Multiple Vitamins-Minerals (MULTIVITAMIN PO), Take by mouth., Disp: , Rfl:    olmesartan -hydrochlorothiazide  (BENICAR  HCT) 40-25 MG tablet, Take 1 tablet by mouth daily., Disp: 30 tablet, Rfl: 0   pioglitazone  (ACTOS ) 45 MG tablet, Take 1 tablet (45 mg total) by mouth daily., Disp: 30 tablet, Rfl: 0   tirzepatide  (MOUNJARO ) 10 MG/0.5ML Pen, Inject 10 mg into the skin once a week. (Patient not  taking: Reported on 06/27/2024), Disp: 6 mL, Rfl: 0   tirzepatide  (MOUNJARO ) 7.5 MG/0.5ML Pen, Inject 7.5 mg into the skin once a week. (Patient not taking: Reported on 06/27/2024), Disp: 2 mL, Rfl: 0   VITAMIN D  PO, Take 1 tablet by mouth daily., Disp: , Rfl:   Observations/Objective: Patient is well-developed, well-nourished in no acute distress.  Resting comfortably  at home.  Head is normocephalic, atraumatic.  No labored breathing.  Speech is clear and coherent with logical content.  Patient is alert and oriented at baseline.  Dry nonproductive cough  Assessment and Plan: 1. Acute bronchitis, unspecified organism (Primary) - benzonatate  (TESSALON ) 200 MG capsule; Take 1 capsule (200 mg total) by mouth 2 (two) times daily as needed for cough.  Dispense: 20 capsule; Refill: 0 - promethazine -dextromethorphan (PROMETHAZINE -DM) 6.25-15 MG/5ML syrup; Take 5 mLs by mouth 3 (three) times daily as needed for cough.  Dispense: 118 mL; Refill: 0 - fluticasone  (FLONASE ) 50 MCG/ACT nasal spray; Place 2 sprays into both nostrils daily.  Dispense: 16 g; Refill: 6  - Take meds as prescribed - Use a cool mist humidifier  -Use  saline nose sprays frequently -Force fluids -For any cough or congestion  Use plain Mucinex- regular strength or max strength is fine -For fever or aces or pains- take tylenol or ibuprofen. -Throat lozenges if help -Follow up if symptoms worsen or do not improve   Follow Up Instructions: I discussed the assessment and treatment plan with the patient. The patient was provided an opportunity to ask questions and all were answered. The patient agreed with the plan and demonstrated an understanding of the instructions.  A copy of instructions were sent to the patient via MyChart unless otherwise noted below.     The patient was advised to call back or seek an in-person evaluation if the symptoms worsen or if the condition fails to improve as anticipated.    Cynthia Learn, FNP

## 2024-07-27 NOTE — Patient Instructions (Signed)

## 2024-08-01 DIAGNOSIS — F4389 Other reactions to severe stress: Secondary | ICD-10-CM | POA: Diagnosis not present

## 2024-08-06 ENCOUNTER — Encounter: Payer: Self-pay | Admitting: Physician Assistant

## 2024-08-06 ENCOUNTER — Telehealth: Payer: Self-pay | Admitting: Physician Assistant

## 2024-08-06 ENCOUNTER — Ambulatory Visit (INDEPENDENT_AMBULATORY_CARE_PROVIDER_SITE_OTHER): Admitting: Physician Assistant

## 2024-08-06 VITALS — BP 150/84 | HR 96 | Ht 67.0 in | Wt 348.0 lb

## 2024-08-06 DIAGNOSIS — E1165 Type 2 diabetes mellitus with hyperglycemia: Secondary | ICD-10-CM | POA: Diagnosis not present

## 2024-08-06 DIAGNOSIS — I10 Essential (primary) hypertension: Secondary | ICD-10-CM | POA: Diagnosis not present

## 2024-08-06 DIAGNOSIS — E785 Hyperlipidemia, unspecified: Secondary | ICD-10-CM

## 2024-08-06 DIAGNOSIS — F4389 Other reactions to severe stress: Secondary | ICD-10-CM | POA: Diagnosis not present

## 2024-08-06 DIAGNOSIS — Z0000 Encounter for general adult medical examination without abnormal findings: Secondary | ICD-10-CM | POA: Diagnosis not present

## 2024-08-06 DIAGNOSIS — Z7984 Long term (current) use of oral hypoglycemic drugs: Secondary | ICD-10-CM

## 2024-08-06 DIAGNOSIS — Z6841 Body Mass Index (BMI) 40.0 and over, adult: Secondary | ICD-10-CM

## 2024-08-06 DIAGNOSIS — E66813 Obesity, class 3: Secondary | ICD-10-CM

## 2024-08-06 LAB — POCT GLYCOSYLATED HEMOGLOBIN (HGB A1C): Hemoglobin A1C: 13.3 % — AB (ref 4.0–5.6)

## 2024-08-06 LAB — POCT UA - MICROALBUMIN
Creatinine, POC: 50 mg/dL
Microalbumin Ur, POC: 30 mg/L

## 2024-08-06 MED ORDER — TOUJEO SOLOSTAR 300 UNIT/ML ~~LOC~~ SOPN: PEN_INJECTOR | SUBCUTANEOUS | 2 refills | Status: DC

## 2024-08-06 MED ORDER — PIOGLITAZONE HCL 45 MG PO TABS: 45.0000 mg | ORAL_TABLET | Freq: Every day | ORAL | 0 refills | Status: DC

## 2024-08-06 MED ORDER — AMLODIPINE BESYLATE 5 MG PO TABS: 5.0000 mg | ORAL_TABLET | Freq: Every day | ORAL | 0 refills | Status: DC

## 2024-08-06 MED ORDER — TIRZEPATIDE 5 MG/0.5ML ~~LOC~~ SOAJ: 5.0000 mg | SUBCUTANEOUS | 0 refills | Status: AC

## 2024-08-06 MED ORDER — MOUNJARO 2.5 MG/0.5ML ~~LOC~~ SOAJ: 2.5000 mg | SUBCUTANEOUS | 0 refills | Status: DC

## 2024-08-06 MED ORDER — ATORVASTATIN CALCIUM 80 MG PO TABS: 80.0000 mg | ORAL_TABLET | Freq: Every day | ORAL | 3 refills | Status: AC

## 2024-08-06 MED ORDER — SYNJARDY XR 10-1000 MG PO TB24: 1.0000 | ORAL_TABLET | Freq: Every day | ORAL | 0 refills | Status: DC

## 2024-08-06 MED ORDER — TIRZEPATIDE 7.5 MG/0.5ML ~~LOC~~ SOAJ: 7.5000 mg | SUBCUTANEOUS | 0 refills | Status: AC

## 2024-08-06 MED ORDER — OLMESARTAN MEDOXOMIL-HCTZ 40-25 MG PO TABS: 1.0000 | ORAL_TABLET | Freq: Every day | ORAL | 1 refills | Status: AC

## 2024-08-06 NOTE — Patient Instructions (Signed)
 Increase norvasc  to 5mg  daily.  Added mounjaro  2.5mg  weekly.  Added toujeo  10 units at bedtime to increase by 2 units every 5 days until fasting glucose 90-130.  Continue on same medications

## 2024-08-06 NOTE — Telephone Encounter (Signed)
 Please see message from pharmacy, regarding Toujeo . Pharmacy requests call back to verify maximum daily dosage for insurance   Copied from CRM [id number]. Topic: Clinical - Prescription Issue >> Aug 06, 2024  4:21 PM Zane F wrote: Reason for CRM:   Caller: Warren   Calling From: [facility]  Prescription: insulin  glargine, 1 Unit Dial, (TOUJEO  SOLOSTAR) 300 UNIT/ML Solostar Pen  Reason for Call: In order to verify the prescription for the patient's insurance the provider will need to provide a maximum daily dosage. They also need to confirm the maximum unit per day. Ask for the pharmacist upon callback.  Contact Information:   [id number] - [place], [place] - [address] [place] [address] Phone: [phone] Fax: [phone] Hours: Not open 24 hours

## 2024-08-07 LAB — CMP14+EGFR
ALT: 10 IU/L (ref 0–32)
AST: 17 IU/L (ref 0–40)
Albumin: 3.8 g/dL — ABNORMAL LOW (ref 3.9–4.9)
Alkaline Phosphatase: 97 IU/L (ref 44–121)
BUN/Creatinine Ratio: 20 (ref 9–23)
BUN: 14 mg/dL (ref 6–24)
Bilirubin Total: 0.5 mg/dL (ref 0.0–1.2)
CO2: 22 mmol/L (ref 20–29)
Calcium: 9.4 mg/dL (ref 8.7–10.2)
Chloride: 102 mmol/L (ref 96–106)
Creatinine, Ser: 0.71 mg/dL (ref 0.57–1.00)
Globulin, Total: 3.5 g/dL (ref 1.5–4.5)
Glucose: 231 mg/dL — ABNORMAL HIGH (ref 70–99)
Potassium: 4 mmol/L (ref 3.5–5.2)
Sodium: 137 mmol/L (ref 134–144)
Total Protein: 7.3 g/dL (ref 6.0–8.5)
eGFR: 104 mL/min/1.73 (ref >59)

## 2024-08-07 LAB — CBC WITH DIFFERENTIAL/PLATELET
Basophils Absolute: 0 x10E3/uL (ref 0.0–0.2)
Basos: 1 %
EOS (ABSOLUTE): 0.2 x10E3/uL (ref 0.0–0.4)
Eos: 3 %
Hematocrit: 40.3 % (ref 34.0–46.6)
Hemoglobin: 13 g/dL (ref 11.1–15.9)
Immature Grans (Abs): 0 x10E3/uL (ref 0.0–0.1)
Immature Granulocytes: 0 %
Lymphocytes Absolute: 1.8 x10E3/uL (ref 0.7–3.1)
Lymphs: 30 %
MCH: 28 pg (ref 26.6–33.0)
MCHC: 32.3 g/dL (ref 31.5–35.7)
MCV: 87 fL (ref 79–97)
Monocytes Absolute: 0.4 x10E3/uL (ref 0.1–0.9)
Monocytes: 6 %
Neutrophils Absolute: 3.7 x10E3/uL (ref 1.4–7.0)
Neutrophils: 60 %
Platelets: 295 x10E3/uL (ref 150–450)
RBC: 4.64 x10E6/uL (ref 3.77–5.28)
RDW: 14.1 % (ref 11.7–15.4)
WBC: 6 x10E3/uL (ref 3.4–10.8)

## 2024-08-07 LAB — TSH: TSH: 2.47 u[IU]/mL (ref 0.450–4.500)

## 2024-08-07 LAB — LIPID PANEL
Chol/HDL Ratio: 4.6 ratio — ABNORMAL HIGH (ref 0.0–4.4)
Cholesterol, Total: 198 mg/dL (ref 100–199)
HDL: 43 mg/dL (ref >39)
LDL Chol Calc (NIH): 134 mg/dL — ABNORMAL HIGH (ref 0–99)
Triglycerides: 115 mg/dL (ref 0–149)
VLDL Cholesterol Cal: 21 mg/dL (ref 5–40)

## 2024-08-07 LAB — VITAMIN D 25 HYDROXY (VIT D DEFICIENCY, FRACTURES): Vit D, 25-Hydroxy: 23.1 ng/mL — ABNORMAL LOW (ref 30.0–100.0)

## 2024-08-07 NOTE — Telephone Encounter (Signed)
 Pharmacy informed.

## 2024-08-07 NOTE — Telephone Encounter (Signed)
 Max dose of 40 units

## 2024-08-07 NOTE — Telephone Encounter (Signed)
 Attempted call to pharmacy . Held for 8 minutes without answer. Will attempt to call again at later time.

## 2024-08-08 ENCOUNTER — Ambulatory Visit: Payer: Self-pay | Admitting: Physician Assistant

## 2024-08-08 NOTE — Progress Notes (Signed)
 Cynthia Lee,   Thyroid looks good.  Vitamin D  better but not to goal. I would like 2000 units with dairy daily for absorption.  LDL not to goal. Have you been taking lipitor 80mg  ?  Albumin low. Increase protein in diet.

## 2024-08-12 ENCOUNTER — Encounter: Payer: Self-pay | Admitting: Physician Assistant

## 2024-08-12 NOTE — Progress Notes (Signed)
 Established Patient Office Visit  Subjective   Patient ID: Cynthia Lee, female    DOB: January 07, 1976  Age: 48 y.o. MRN: [id number]   HPI Pt is a 48 yo obese female who presents to the clinic after not being seen in one year to get back on track. She went to a work screening and found out her sugars were really high. She wants to get back on medications today.   She admits that she has not been compliant but intermittently taking all of her medication. She is more compliant with BP medication. She is not checking her sugars right now but plans to start. She is not checking her BP at home but plans to start. She denies any CP, palpitations, headaches or dizziness. She feels tired and run down a lot but no numbness or tingling of extremities. She has recently been sick and dx with bronchitis. She is feeling a little better today. She goes to my eye doctor for her eye exams.      ROS See HPI.    Objective:     BP (!) 150/84   Pulse 96   Ht 5' 7 (1.702 m)   Wt (!) 348 lb (157.9 kg)   SpO2 99%   BMI 54.50 kg/m  BP Readings from Last 3 Encounters:  08/06/24 (!) 150/84  06/27/24 (!) 124/90  05/08/23 132/78   Wt Readings from Last 3 Encounters:  08/06/24 (!) 348 lb (157.9 kg)  06/27/24 (!) 341 lb (154.7 kg)  05/08/23 (!) 359 lb (162.8 kg)    .SABRA Lab Results  Component Value Date   HGBA1C 13.3 (A) 08/06/2024     Physical Exam Constitutional:      Appearance: Normal appearance. She is obese.  Cardiovascular:     Rate and Rhythm: Normal rate and regular rhythm.  Pulmonary:     Effort: Pulmonary effort is normal.     Breath sounds: Normal breath sounds.  Neurological:     General: No focal deficit present.     Mental Status: She is alert and oriented to person, place, and time.  Psychiatric:        Mood and Affect: Mood normal.        The 10-year ASCVD risk score (Arnett DK, et al., 2019) is: 5.9%    Assessment & Plan:  SABRASABRAKaralyne was seen today for annual  exam.  Diagnoses and all orders for this visit:  Uncontrolled type 2 diabetes mellitus with hyperglycemia (HCC) -     CBC with Differential/Platelet -     CMP14+EGFR -     Lipid panel -     TSH -     VITAMIN D  25 Hydroxy (Vit-D Deficiency, Fractures) -     POCT HgB A1C -     POCT UA - Microalbumin -     Empagliflozin -metFORMIN  HCl ER (SYNJARDY  XR) 08-999 MG TB24; Take 1 tablet by mouth daily. -     pioglitazone  (ACTOS ) 45 MG tablet; Take 1 tablet (45 mg total) by mouth daily. -     tirzepatide  (MOUNJARO ) 2.5 MG/0.5ML Pen; Inject 2.5 mg into the skin once a week. -     tirzepatide  (MOUNJARO ) 5 MG/0.5ML Pen; Inject 5 mg into the skin once a week. -     tirzepatide  (MOUNJARO ) 7.5 MG/0.5ML Pen; Inject 7.5 mg into the skin once a week. -     insulin  glargine, 1 Unit Dial, (TOUJEO  SOLOSTAR) 300 UNIT/ML Solostar Pen; Start 10 units at bedtime and increase by  2 units every 5 days until sugars in the morning are 90-130.  Encounter for annual physical exam -     CBC with Differential/Platelet -     CMP14+EGFR -     Lipid panel -     TSH -     VITAMIN D  25 Hydroxy (Vit-D Deficiency, Fractures)  Dyslipidemia, goal LDL below 70 -     Lipid panel -     atorvastatin  (LIPITOR) 80 MG tablet; Take 1 tablet (80 mg total) by mouth daily.  Uncontrolled hypertension -     CMP14+EGFR -     amLODipine  (NORVASC ) 5 MG tablet; Take 1 tablet (5 mg total) by mouth daily. -     olmesartan -hydrochlorothiazide  (BENICAR  HCT) 40-25 MG tablet; Take 1 tablet by mouth daily.  Class 3 severe obesity due to excess calories with serious comorbidity and body mass index (BMI) of 50.0 to 59.9 in adult   A1C not to goal Discussed in depth the risk of not controlling sugars and blood pressure Added toujeo  today 10 units with instructions to increase by 2 units every 5 days until fasting glucose is 90-120 Restart mounjaro  2.5 and increase monthly to 5mg  then 7.5mg  Discussed side effects and how to take weekly Continue  synjardy  and actos  as well BP not to goal, on ARB/hydrochlorothiazide  increased amlodapine to 5mg  daily Restart lipitor, labs ordered today Need to get eye exam Pt declined vaccines today due to recent bronchitis  Return in about 3 months (around 11/05/2024) for DM follow up.    Axavier Pressley, PA-C

## 2024-08-28 DIAGNOSIS — F4389 Other reactions to severe stress: Secondary | ICD-10-CM | POA: Diagnosis not present

## 2024-09-04 DIAGNOSIS — F4389 Other reactions to severe stress: Secondary | ICD-10-CM | POA: Diagnosis not present

## 2024-09-12 NOTE — Progress Notes (Signed)
 Cynthia Lee                                          MRN: [id number]   09/12/2024   The [facility] Quality Team Specialist reviewed this patient medical record for the purposes of chart review for care gap closure. The following were reviewed: chart review for care gap closure-glycemic status assessment.    [facility] Quality Team

## 2024-09-12 NOTE — Progress Notes (Signed)
 Lyndi Holbein                                          MRN: [id number]   09/12/2024   The [facility] Quality Team Specialist reviewed this patient medical record for the purposes of chart review for care gap closure. The following were reviewed: abstraction for care gap closure-kidney health evaluation for diabetes:eGFR  and uACR.    [facility] Quality Team

## 2024-09-12 NOTE — Progress Notes (Signed)
 Cynthia Lee                                          MRN: [id number]   09/12/2024   The [facility] Quality Team Specialist reviewed this patient medical record for the purposes of chart review for care gap closure. The following were reviewed: chart review for care gap closure-controlling blood pressure.    [facility] Quality Team

## 2024-11-05 ENCOUNTER — Ambulatory Visit: Admitting: Physician Assistant

## 2024-11-05 ENCOUNTER — Encounter: Payer: Self-pay | Admitting: Physician Assistant

## 2024-11-05 VITALS — BP 140/90 | HR 99 | Ht 67.0 in | Wt 351.0 lb

## 2024-11-05 DIAGNOSIS — E1165 Type 2 diabetes mellitus with hyperglycemia: Secondary | ICD-10-CM

## 2024-11-05 DIAGNOSIS — Z7984 Long term (current) use of oral hypoglycemic drugs: Secondary | ICD-10-CM

## 2024-11-05 DIAGNOSIS — Z7985 Long-term (current) use of injectable non-insulin antidiabetic drugs: Secondary | ICD-10-CM | POA: Diagnosis not present

## 2024-11-05 DIAGNOSIS — E785 Hyperlipidemia, unspecified: Secondary | ICD-10-CM | POA: Diagnosis not present

## 2024-11-05 DIAGNOSIS — I10 Essential (primary) hypertension: Secondary | ICD-10-CM | POA: Diagnosis not present

## 2024-11-05 LAB — POCT GLYCOSYLATED HEMOGLOBIN (HGB A1C): Hemoglobin A1C: 10.1 % — AB (ref 4.0–5.6)

## 2024-11-05 MED ORDER — PIOGLITAZONE HCL 45 MG PO TABS: 45.0000 mg | ORAL_TABLET | Freq: Every day | ORAL | 0 refills | Status: AC

## 2024-11-05 MED ORDER — BLOOD GLUCOSE MONITORING SUPPL DEVI: 1.0000 | Freq: Three times a day (TID) | 0 refills | Status: AC

## 2024-11-05 MED ORDER — SYNJARDY XR 10-1000 MG PO TB24: 1.0000 | ORAL_TABLET | Freq: Every day | ORAL | 0 refills | Status: AC

## 2024-11-05 MED ORDER — AMLODIPINE BESYLATE 10 MG PO TABS: 10.0000 mg | ORAL_TABLET | Freq: Every day | ORAL | 1 refills | Status: AC

## 2024-11-05 NOTE — Progress Notes (Unsigned)
° °  Established Patient Office Visit  Subjective   Patient ID: Sherika Kubicki, female    DOB: 26-Dec-1975  Age: 48 y.o. MRN: [id number]  Chief Complaint  Patient presents with   Medical Management of Chronic Issues    HPI  {History (Optional):23778}  ROS    Objective:     BP (!) 167/97   Pulse 99   Ht 5' 7 (1.702 m)   Wt (!) 351 lb (159.2 kg)   SpO2 99%   BMI 54.97 kg/m  BP Readings from Last 3 Encounters:  11/05/24 (!) 167/97  08/06/24 (!) 150/84  06/27/24 (!) 124/90   Wt Readings from Last 3 Encounters:  11/05/24 (!) 351 lb (159.2 kg)  08/06/24 (!) 348 lb (157.9 kg)  06/27/24 (!) 341 lb (154.7 kg)      Physical Exam    The 10-year ASCVD risk score (Arnett DK, et al., 2019) is: 7.2%    Assessment & Plan:     No follow-ups on file.    Javar Eshbach, PA-C

## 2024-11-05 NOTE — Patient Instructions (Addendum)
 Increased mounjaro  based on what pharmacy says you are taking.  Increased amlodapine 10mg .

## 2024-11-18 ENCOUNTER — Other Ambulatory Visit (HOSPITAL_BASED_OUTPATIENT_CLINIC_OR_DEPARTMENT_OTHER): Payer: Self-pay | Admitting: Physician Assistant

## 2024-11-18 ENCOUNTER — Ambulatory Visit: Payer: Self-pay

## 2024-11-18 DIAGNOSIS — Z1231 Encounter for screening mammogram for malignant neoplasm of breast: Secondary | ICD-10-CM

## 2024-11-18 NOTE — Telephone Encounter (Signed)
 FYI Only or Action Required?: FYI only for provider: appointment scheduled on 12/29.  Patient was last seen in primary care on 11/05/2024 by Antoniette Vermell CROME, PA-C.  Called Nurse Triage reporting finger swelling.  Symptoms began several weeks ago.  Interventions attempted: Rest, hydration, or home remedies and Ice/heat application.  Symptoms are: gradually improving.  Triage Disposition: See PCP When Office is Open (Within 3 Days)  Patient/caregiver understands and will follow disposition?: Yes  Copied from CRM [id number]. Topic: Clinical - Red Word Triage >> Nov 18, 2024 12:54 PM Aleatha C wrote: Red Word that prompted transfer to Nurse Triage: two fingers swollen after getting manicure believes to be infected tender to touch as well as white around the nail Reason for Disposition  [1] Small area of localized swelling AND [2] not better after 3 days  Answer Assessment - Initial Assessment Questions Got her nails done a few weeks ago- nail tech rinsed pt hands in the pedicure water. Since then her ring finger and middle finger on her right hand have had swelling, and redness. Denies fever. Swelling has gone down significantly with soaking it and rinsing out the areas looking like potential infection.   Was having discharge now, White dry and flaky. Mild swelling just noticeable. And redness. Able to move fingers. Denies temp change.   Appt made with PCP office. Advised UC over the weekend for worsening or fever. understood  1. ONSET: When did the swelling start? (e.g., minutes, hours, days)     A few weeks  2. LOCATION: What part of the hand is swollen?  Are both hands swollen or just one hand?     2 fingers on the right hand- middle and ring finger 3. TYPE : What does it look like? (e.g., ball, lump; localized; hand swelling)     Finger swelling  4. SWELLING SEVERITY: If more than a lump or localized, ask: How bad is the hand swelling? (e.g., mild, moderate, severe;  describe)     Couldn't move them it was so swollen -- now just noticeably larger  5. REDNESS: Is there redness or signs of infection?     redness 6. PAIN: Is the swelling painful to touch? If Yes, ask: How painful is it?   (Scale 1-10; mild, moderate or severe)     1/10 not really hurting just swollen-- when really swollen at worst 6/10 (at night when hand under pillow 10/10)  7. FEVER: Do you have a fever? If Yes, ask: What is it, how was it measured, and when did it start?      Denies  8. CAUSE: What do you think is causing the hand swelling? (e.g., heat, insect bite, pregnancy, recent injury)     From getting nails done  9. MEDICAL HISTORY: Do you have a history of heart failure, kidney disease, liver failure, or cancer?     DM  10. RECURRENT SYMPTOM: Have you had hand swelling before? If Yes, ask: When was the last time? What happened that time?       Denies  11. OTHER SYMPTOMS: Do you have any other symptoms? (e.g., blurred vision, difficulty breathing, headache)       Denies  Protocols used: Hand Swelling-A-AH

## 2024-11-22 ENCOUNTER — Ambulatory Visit (HOSPITAL_BASED_OUTPATIENT_CLINIC_OR_DEPARTMENT_OTHER)

## 2024-11-24 ENCOUNTER — Ambulatory Visit: Admitting: Family Medicine

## 2024-11-24 NOTE — Telephone Encounter (Signed)
 Routing to covering provider -   The patient is scheduled with Dr. Alvia on 11/26/24 to evaluate the following symptoms.

## 2024-11-24 NOTE — Telephone Encounter (Signed)
 Patient is calling to report she has sick child and the only appointment she could get was at the time of her appointment today. Patient reports she has not had change in her finger- still the same. Rescheduled appointment for first available - Wednesday.

## 2024-11-25 ENCOUNTER — Ambulatory Visit (HOSPITAL_BASED_OUTPATIENT_CLINIC_OR_DEPARTMENT_OTHER)
Admission: RE | Admit: 2024-11-25 | Discharge: 2024-11-25 | Disposition: A | Discharge status: Home / Self Care | Source: Ambulatory Visit | Attending: Physician Assistant | Admitting: Physician Assistant

## 2024-11-25 ENCOUNTER — Encounter (HOSPITAL_BASED_OUTPATIENT_CLINIC_OR_DEPARTMENT_OTHER): Payer: Self-pay

## 2024-11-25 DIAGNOSIS — Z1231 Encounter for screening mammogram for malignant neoplasm of breast: Secondary | ICD-10-CM | POA: Insufficient documentation

## 2024-11-26 ENCOUNTER — Encounter: Payer: Self-pay | Admitting: Family Medicine

## 2024-11-26 ENCOUNTER — Ambulatory Visit: Admitting: Family Medicine

## 2024-11-26 VITALS — BP 168/93 | HR 81 | Ht 67.0 in | Wt 348.0 lb

## 2024-11-26 DIAGNOSIS — M7989 Other specified soft tissue disorders: Secondary | ICD-10-CM | POA: Insufficient documentation

## 2024-11-26 MED ORDER — MUPIROCIN 2 % EX OINT: 1.0000 | TOPICAL_OINTMENT | Freq: Two times a day (BID) | CUTANEOUS | 0 refills | Status: AC

## 2024-11-26 NOTE — Assessment & Plan Note (Signed)
 Sounds like she had paronychia previously. This appears to be resolved.  She may continue soaks as needed.  Mupirocin ointment to use around nail fold if needed.  If this returns we discussed addition of oral antibiotics but I don't think these are needed at this time.

## 2024-11-26 NOTE — Progress Notes (Signed)
 " Cynthia Lee - 48 y.o. female MRN [id number]  Date of birth: May 01, 1976  Subjective Chief Complaint  Patient presents with   Hand Pain    Right middle finger    HPI Cynthia Lee is a 48 y.o. female here today with complaint of finger swelling of the R middle and ring finger.  She has had this for about 1 month. She has been trying epsom salt soaks regularly and has had quite a bit of improvement  No pain or drainage anymore.  Swelling has decreased.  FROM of fingers.  No fever or chills.   ROS:  A comprehensive ROS was completed and negative except as noted per HPI  Allergies[1]  Past Medical History:  Diagnosis Date   Abnormal Pap smear, atypical squamous cells of undetermined sign (ASC-US )    Allergy    started 2021   Anemia    Diabetes mellitus without complication (HCC)    Heart murmur    as a child    Hypertension     Past Surgical History:  Procedure Laterality Date   CESAREAN SECTION     x2   CHOLECYSTECTOMY     COLONOSCOPY WITH PROPOFOL  N/A 10/12/2020   Procedure: COLONOSCOPY WITH PROPOFOL ;  Surgeon: Eda Iha, MD;  Location: [facility];  Service: Gastroenterology;  Laterality: N/A;   GALLBLADDER SURGERY     POLYPECTOMY  10/12/2020   Procedure: POLYPECTOMY;  Surgeon: Eda Iha, MD;  Location: [facility];  Service: Gastroenterology;;   skin graph  2006   WISDOM TOOTH EXTRACTION      Social History   Socioeconomic History   Marital status: Single    Spouse name: Not on file   Number of children: Not on file   Years of education: Not on file   Highest education level: Not on file  Occupational History   Not on file  Tobacco Use   Smoking status: Never   Smokeless tobacco: Never  Vaping Use   Vaping status: Never Used  Substance and Sexual Activity   Alcohol use: Never   Drug use: Not Currently   Sexual activity: Not on file  Other Topics Concern   Not on file  Social History Narrative   Not on file   Social Drivers of Health    Tobacco Use: Low Risk (11/26/2024)   Patient History    Smoking Tobacco Use: Never    Smokeless Tobacco Use: Never    Passive Exposure: Not on file  Financial Resource Strain: Low Risk (04/24/2023)   Overall Financial Resource Strain (CARDIA)    Difficulty of Paying Living Expenses: Not very hard  Food Insecurity: No Food Insecurity (01/02/2023)   Hunger Vital Sign    Worried About Running Out of Food in the Last Year: Never true    Ran Out of Food in the Last Year: Never true  Transportation Needs: No Transportation Needs (01/02/2023)   PRAPARE - Administrator, Civil Service (Medical): No    Lack of Transportation (Non-Medical): No  Physical Activity: Not on file  Stress: No Stress Concern Present (01/02/2023)   [employer] of Occupational Health - Occupational Stress Questionnaire    Feeling of Stress : Only a little  Social Connections: Unknown (04/10/2022)   Received [facility]   Social Network    Social Network: Not on file  Depression (PHQ2-9): Low Risk (08/06/2024)   Depression (PHQ2-9)    PHQ-2 Score: 2  Recent Concern: Depression (PHQ2-9) - Medium Risk (08/06/2024)  Depression (PHQ2-9)    PHQ-2 Score: 5  Alcohol Screen: Not on file  Housing: Low Risk (01/02/2023)   Housing    Last Housing Risk Score: 0  Utilities: Not At Risk (01/02/2023)   AHC Utilities    Threatened with loss of utilities: No  Health Literacy: Adequate Health Literacy (06/27/2023)   B1300 Health Literacy    Frequency of need for help with medical instructions: Never    Family History  Problem Relation Age of Onset   Hyperlipidemia Mother        opiate resistant   Hypertension Mother    Alcohol abuse Father    Diabetes Father    Hypertension Father    Hyperlipidemia Father    Heart attack Father    Stroke Paternal Grandmother    Cancer Paternal Grandmother        ovarian cancer   Diabetes Son    Schizophrenia Brother    Colon cancer Neg Hx    Colon polyps Neg Hx     Esophageal cancer Neg Hx    Rectal cancer Neg Hx    Stomach cancer Neg Hx     Health Maintenance  Topic Date Due   Zoster Vaccines- Shingrix (1 of 2) Never done   Mammogram  11/16/2023   Influenza Vaccine  02/24/2025 (Originally 06/27/2024)   Pneumococcal Vaccine: 50+ Years (1 of 2 - PCV) 08/12/2025 (Originally 07/25/1993)   Hepatitis B Vaccines 19-59 Average Risk (1 of 3 - 19+ 3-dose series) 11/14/2025 (Originally 07/25/1993)   HEMOGLOBIN A1C  05/06/2025   Cervical Cancer Screening (HPV/Pap Cotest)  06/18/2025   OPHTHALMOLOGY EXAM  07/22/2025   Diabetic kidney evaluation - eGFR measurement  08/06/2025   Diabetic kidney evaluation - Urine ACR  08/06/2025   FOOT EXAM  08/06/2025   Colonoscopy  10/12/2030   DTaP/Tdap/Td (2 - Td or Tdap) 03/31/2032   Hepatitis C Screening  Completed   HIV Screening  Completed   HPV VACCINES  Aged Out   Meningococcal B Vaccine  Aged Out   COVID-19 Vaccine  Discontinued     ----------------------------------------------------------------------------------------------------------------------------------------------------------------------------------------------------------------- Physical Exam BP (!) 168/99   Pulse 81   Ht 5' 7 (1.702 m)   Wt (!) 348 lb (157.9 kg)   SpO2 99%   BMI 54.50 kg/m   Physical Exam Constitutional:      Appearance: Normal appearance.  Musculoskeletal:     Comments: No significant swelling, erythema or ttp of the 3rd and 4th digits of the R hand.  Normal ROM.   Neurological:     Mental Status: She is alert.     ------------------------------------------------------------------------------------------------------------------------------------------------------------------------------------------------------------------- Assessment and Plan  Finger swelling Sounds like she had paronychia previously. This appears to be resolved.  She may continue soaks as needed.  Mupirocin ointment to use around nail fold if needed.   If this returns we discussed addition of oral antibiotics but I don't think these are needed at this time.    Meds ordered this encounter  Medications   mupirocin ointment (BACTROBAN) 2 %    Sig: Apply 1 Application topically 2 (two) times daily.    Dispense:  22 g    Refill:  0    No follow-ups on file.        [1]  Allergies Allergen Reactions   Penicillins Other (See Comments)    As a child   "

## 2024-11-26 NOTE — Patient Instructions (Signed)
 Paronychia Paronychia is an infection of the skin. It happens near a fingernail or toenail. It may cause pain and swelling around the nail. In some cases, a fluid-filled bump (abscess) can form near or under the nail. Often, this condition is not serious, and it clears up with treatment. What are the causes? This condition may be caused by a germ. The germ may be bacteria or a fungus. These germs can enter the body through an opening in the skin, such as a cut or a hangnail. Other causes include: Repeated injuries to your fingernails or toenails. Irritation of the base and sides of the nail (cuticle). What increases the risk? This condition is more likely to develop in people who: Get their hands wet often, such as a dishwasher. Bite their fingernails or the base and sides of their nails. Have other skin problems. Have hangnails or hurt fingertips. Come into contact with chemicals like detergents. Have diabetes. What are the signs or symptoms? Redness and swelling of the skin near the nail. A tender feeling around the nail. Pus-filled bumps under the skin at the base and sides of the nail. Fluid or pus under the nail. Pain in the area. How is this treated? Treatment depends on the cause of your condition and how bad it is. If your condition is mild, it may clear up on its own in a few days or after soaking in warm water. If needed, treatment may include: Antibiotic medicine. Antifungal medicine. A procedure to drain pus from a fluid-filled bump. Medicine to treat irritation and swelling (corticosteroids). Taking off part of an ingrown toenail. A bandage (dressing) may be placed over the nail area. Follow these instructions at home: Wound care Keep the affected area clean. Soak the fingers or toes in warm water as told by your doctor. You may be told to do this for 20 minutes, 2-3 times a day. Keep the area dry when you are not soaking it. Do not try to drain a fluid-filled bump on  your own. Follow instructions from your doctor about how to take care of the affected area. Make sure you: Wash your hands with soap and water for at least 20 seconds before and after you change your bandage. If you cannot use soap and water, use hand sanitizer. Change your bandage as told by your doctor. If you had a fluid-filled bump and your doctor drained it, check the area every day for signs of infection. Check for: Redness, swelling, or pain. Fluid or blood. Warmth. Pus or a bad smell. Medicines  Take over-the-counter and prescription medicines only as told by your doctor. If you were prescribed an antibiotic medicine, take it as told by your doctor. Do not stop taking it even if you start to feel better. General instructions Avoid contact with anything that irritates your skin or that you are allergic to. Do not pick at the affected area. Keep all follow-up visits. Prevention To prevent this condition from happening again: Wear rubber gloves when putting your hands in water for washing dishes or other tasks. Wear gloves if your hands might touch cleaners or chemicals. Avoid injuring your nails or fingertips. Do not bite your nails or tear hangnails. Do not cut your nails very short. Do not cut the skin at the base and sides of the nail. Use clean nail clippers or scissors when trimming nails. Contact a doctor if: You feel worse. You do not get better. You keep having or you have more fluid, blood, or  pus coming from the affected area. Your affected finger, toe, or joint gets swollen or hard to move. You have a fever or chills. There is redness spreading from the affected area. Summary Paronychia is an infection of the skin. It happens near a fingernail or toenail. This condition may cause pain and swelling around the nail. Soak the fingers or toes in warm water as told by your doctor. Often, this condition is not serious, and it clears up with treatment. This information  is not intended to replace advice given to you by your health care provider. Make sure you discuss any questions you have with your health care provider. Document Revised: 02/14/2021 Document Reviewed: 02/14/2021 Elsevier Patient Education  2024 [employer].

## 2024-11-28 ENCOUNTER — Ambulatory Visit: Payer: Self-pay | Admitting: Physician Assistant

## 2024-11-28 NOTE — Progress Notes (Signed)
 Normal mammogram. Follow up in 1 year.

## 2025-02-04 ENCOUNTER — Ambulatory Visit: Admitting: Physician Assistant
